# Patient Record
Sex: Female | Born: 1960 | Race: White | Hispanic: No | Marital: Married | Smoking: Never smoker
Health system: Southern US, Community
[De-identification: ages and names within clinical notes are randomized; demographics above are authoritative.]

## PROBLEM LIST (undated history)

## (undated) DIAGNOSIS — E539 Vitamin B deficiency, unspecified: Secondary | ICD-10-CM

## (undated) DIAGNOSIS — F419 Anxiety disorder, unspecified: Secondary | ICD-10-CM

## (undated) DIAGNOSIS — D649 Anemia, unspecified: Secondary | ICD-10-CM

## (undated) DIAGNOSIS — E559 Vitamin D deficiency, unspecified: Secondary | ICD-10-CM

## (undated) DIAGNOSIS — K219 Gastro-esophageal reflux disease without esophagitis: Secondary | ICD-10-CM

## (undated) DIAGNOSIS — T7840XA Allergy, unspecified, initial encounter: Secondary | ICD-10-CM

## (undated) DIAGNOSIS — Z973 Presence of spectacles and contact lenses: Secondary | ICD-10-CM

## (undated) HISTORY — DX: Allergy, unspecified, initial encounter: T78.40XA

## (undated) HISTORY — DX: Anxiety disorder, unspecified: F41.9

## (undated) HISTORY — DX: Gastro-esophageal reflux disease without esophagitis: K21.9

## (undated) HISTORY — DX: Vitamin D deficiency, unspecified: E55.9

## (undated) HISTORY — DX: Vitamin B deficiency, unspecified: E53.9

---

## 2018-02-01 ENCOUNTER — Other Ambulatory Visit: Payer: Self-pay | Admitting: Nurse Practitioner

## 2018-02-01 DIAGNOSIS — Z1231 Encounter for screening mammogram for malignant neoplasm of breast: Secondary | ICD-10-CM

## 2018-02-01 LAB — HM HEPATITIS C SCREENING LAB: HM Hepatitis Screen: NEGATIVE

## 2018-02-08 ENCOUNTER — Inpatient Hospital Stay: Admission: RE | Admit: 2018-02-08 | Payer: Self-pay | Source: Ambulatory Visit

## 2018-02-16 ENCOUNTER — Encounter: Payer: Self-pay | Admitting: Radiology

## 2018-02-16 ENCOUNTER — Ambulatory Visit
Admission: RE | Admit: 2018-02-16 | Discharge: 2018-02-16 | Disposition: A | Discharge status: Home / Self Care | Source: Ambulatory Visit | Attending: Nurse Practitioner | Admitting: Nurse Practitioner

## 2018-02-16 ENCOUNTER — Encounter (INDEPENDENT_AMBULATORY_CARE_PROVIDER_SITE_OTHER): Payer: Self-pay

## 2018-02-16 DIAGNOSIS — Z1231 Encounter for screening mammogram for malignant neoplasm of breast: Secondary | ICD-10-CM | POA: Insufficient documentation

## 2018-02-18 ENCOUNTER — Inpatient Hospital Stay
Admission: RE | Admit: 2018-02-18 | Discharge: 2018-02-18 | Disposition: A | Discharge status: Home / Self Care | Payer: Self-pay | Source: Ambulatory Visit | Attending: *Deleted | Admitting: *Deleted

## 2018-02-18 ENCOUNTER — Other Ambulatory Visit: Payer: Self-pay | Admitting: *Deleted

## 2018-02-18 DIAGNOSIS — Z9289 Personal history of other medical treatment: Secondary | ICD-10-CM

## 2020-10-15 ENCOUNTER — Other Ambulatory Visit: Payer: Self-pay

## 2020-10-15 ENCOUNTER — Encounter: Payer: Self-pay | Admitting: Internal Medicine

## 2020-10-15 ENCOUNTER — Ambulatory Visit (INDEPENDENT_AMBULATORY_CARE_PROVIDER_SITE_OTHER): Admitting: Internal Medicine

## 2020-10-15 VITALS — BP 138/76 | HR 60 | Ht 66.5 in | Wt 225.0 lb

## 2020-10-15 DIAGNOSIS — E538 Deficiency of other specified B group vitamins: Secondary | ICD-10-CM

## 2020-10-15 DIAGNOSIS — E782 Mixed hyperlipidemia: Secondary | ICD-10-CM | POA: Insufficient documentation

## 2020-10-15 DIAGNOSIS — Z1231 Encounter for screening mammogram for malignant neoplasm of breast: Secondary | ICD-10-CM

## 2020-10-15 DIAGNOSIS — Z1211 Encounter for screening for malignant neoplasm of colon: Secondary | ICD-10-CM

## 2020-10-15 DIAGNOSIS — E669 Obesity, unspecified: Secondary | ICD-10-CM | POA: Insufficient documentation

## 2020-10-15 DIAGNOSIS — K219 Gastro-esophageal reflux disease without esophagitis: Secondary | ICD-10-CM | POA: Insufficient documentation

## 2020-10-15 DIAGNOSIS — E559 Vitamin D deficiency, unspecified: Secondary | ICD-10-CM

## 2020-10-15 HISTORY — DX: Gastro-esophageal reflux disease without esophagitis: K21.9

## 2020-10-15 NOTE — Progress Notes (Signed)
Date:  10/15/2020   Name:  Jessica Walter   DOB:  1961-04-15   MRN:  [id number]   Chief Complaint: Establish Care and Gastroesophageal Reflux (Raw honey, apple cider vinegar, and apple slices is helping soothe this but she wants to know why she has such bad stomach acid issues.)  Immunization History  Administered Date(s) Administered  . Influenza-Unspecified 06/02/2019, 04/25/2020  . Moderna Sars-Covid-2 Vaccination 11/15/2019, 12/13/2019, 07/24/2020    Gastroesophageal Reflux She complains of heartburn. She reports no abdominal pain, no chest pain, no choking or no dysphagia. This is a recurrent problem. The problem has been waxing and waning. The heartburn duration is less than a minute. The heartburn is located in the substernum. The heartburn does not wake her from sleep. The heartburn changes with position. The symptoms are aggravated by stress and bending. Associated symptoms include fatigue. Treatments tried: vinegar, honey, apple.  She admits to many recent life stressors over the past few years.  She has moved several times as well as several family tragedies.  She is finally in [place] where she wants to be.  Previous PCP wanted her to take Effexor but she did not chose to take it.  She feels like her situation is improving but still contributing to her stomach issues.   No results found for: CREATININE, BUN, NA, K, CL, CO2 No results found for: CHOL, HDL, LDLCALC, LDLDIRECT, TRIG, CHOLHDL No results found for: TSH No results found for: HGBA1C No results found for: WBC, HGB, HCT, MCV, PLT No results found for: ALT, AST, GGT, ALKPHOS, BILITOT   Review of Systems  Constitutional: Positive for fatigue and unexpected weight change.  Respiratory: Negative for choking and shortness of breath.   Cardiovascular: Negative for chest pain, palpitations and leg swelling.  Gastrointestinal: Positive for heartburn. Negative for abdominal distention, abdominal pain, blood in stool and  dysphagia.       Gerd  Neurological: Negative for dizziness, light-headedness and headaches.  Psychiatric/Behavioral: Positive for decreased concentration and sleep disturbance. Negative for suicidal ideas.    There are no problems to display for this patient.   No Known Allergies  History reviewed. No pertinent surgical history.  Social History   Tobacco Use  . Smoking status: Never Smoker  . Smokeless tobacco: Never Used  Vaping Use  . Vaping Use: Never used  Substance Use Topics  . Alcohol use: Yes    Comment: rare  . Drug use: Never     Medication list has been reviewed and updated.  Current Meds  Medication Sig  . Multiple Vitamin (MULTIVITAMIN) capsule Take 1 capsule by mouth daily.  Marland Kitchen VALERIAN ROOT PLUS PO Take by mouth.    PHQ 2/9 Scores 10/15/2020  PHQ - 2 Score 2  PHQ- 9 Score 10    GAD 7 : Generalized Anxiety Score 10/15/2020  Nervous, Anxious, on Edge 1  Control/stop worrying 1  Worry too much - different things 1  Trouble relaxing 2  Restless 1  Easily annoyed or irritable 0  Afraid - awful might happen 1  Total GAD 7 Score 7  Anxiety Difficulty Not difficult at all    BP Readings from Last 3 Encounters:  10/15/20 138/76    Physical Exam Constitutional:      Appearance: Normal appearance.  Cardiovascular:     Rate and Rhythm: Normal rate and regular rhythm.     Pulses: Normal pulses.     Heart sounds: No murmur heard.   Pulmonary:  Effort: Pulmonary effort is normal.     Breath sounds: Normal breath sounds. No wheezing.  Abdominal:     General: There is distension.     Palpations: Abdomen is soft.     Tenderness: There is abdominal tenderness in the epigastric area. There is no right CVA tenderness, left CVA tenderness, guarding or rebound.     Hernia: No hernia is present.  Musculoskeletal:     Cervical back: Normal range of motion.  Lymphadenopathy:     Cervical: No cervical adenopathy.  Neurological:     Mental Status: She  is alert.  Psychiatric:        Attention and Perception: Attention normal.        Mood and Affect: Mood is anxious.        Speech: Speech normal.        Cognition and Memory: Cognition normal.     Wt Readings from Last 3 Encounters:  10/15/20 225 lb (102.1 kg)    BP 138/76   Pulse 60   Ht 5' 6.5" (1.689 m)   Wt 225 lb (102.1 kg)   SpO2 95%   BMI 35.77 kg/m   Assessment and Plan: 1. Gastroesophageal reflux disease, unspecified whether esophagitis present Ongoing gerd sx partially treated with home remedies.  However we need to find the cause and address it more effectively. If H Pylori is negative will consider GI referral. - CBC with Differential/Platelet - H. pylori breath test  2. Vitamin B12 deficiency On supplements in the past; currently just a multi-vitamin - Vitamin B12  3. Vitamin D deficiency On supplements in the past; currently just a multi-vitamin - VITAMIN D 25 Hydroxy (Vit-D Deficiency, Fractures)  4. Colon cancer screening Overdue for screening - not ready to have a colonoscopy at this time - Fecal occult blood, imunochemical  5. Encounter for screening mammogram for breast cancer Last mammogram 2019 - - MM 3D SCREEN BREAST BILATERAL; Future   Partially dictated using Editor, commissioning. Any errors are unintentional.  Halina Maidens, MD [facility]  10/15/2020

## 2020-10-16 LAB — CBC WITH DIFFERENTIAL/PLATELET
Basophils Absolute: 0.1 10*3/uL (ref 0.0–0.2)
Basos: 1 %
EOS (ABSOLUTE): 0.1 10*3/uL (ref 0.0–0.4)
Eos: 2 %
Hematocrit: 41.1 % (ref 34.0–46.6)
Hemoglobin: 13.7 g/dL (ref 11.1–15.9)
Immature Grans (Abs): 0 10*3/uL (ref 0.0–0.1)
Immature Granulocytes: 0 %
Lymphocytes Absolute: 2.6 10*3/uL (ref 0.7–3.1)
Lymphs: 33 %
MCH: 30.2 pg (ref 26.6–33.0)
MCHC: 33.3 g/dL (ref 31.5–35.7)
MCV: 91 fL (ref 79–97)
Monocytes Absolute: 0.4 10*3/uL (ref 0.1–0.9)
Monocytes: 5 %
Neutrophils Absolute: 4.7 10*3/uL (ref 1.4–7.0)
Neutrophils: 59 %
Platelets: 320 10*3/uL (ref 150–450)
RBC: 4.53 x10E6/uL (ref 3.77–5.28)
RDW: 12.8 % (ref 11.7–15.4)
WBC: 8 10*3/uL (ref 3.4–10.8)

## 2020-10-16 LAB — VITAMIN B12: Vitamin B-12: 707 pg/mL (ref 232–1245)

## 2020-10-16 LAB — VITAMIN D 25 HYDROXY (VIT D DEFICIENCY, FRACTURES): Vit D, 25-Hydroxy: 38 ng/mL (ref 30.0–100.0)

## 2020-10-17 ENCOUNTER — Other Ambulatory Visit: Payer: Self-pay | Admitting: Internal Medicine

## 2020-10-17 DIAGNOSIS — A048 Other specified bacterial intestinal infections: Secondary | ICD-10-CM

## 2020-10-17 LAB — H. PYLORI BREATH TEST: H pylori Breath Test: POSITIVE — AB

## 2020-10-17 MED ORDER — AMOXICILLIN 500 MG PO CAPS: 1000.0000 mg | ORAL_CAPSULE | Freq: Two times a day (BID) | ORAL | 0 refills | Status: AC

## 2020-10-17 MED ORDER — OMEPRAZOLE 20 MG PO CPDR
20.0000 mg | DELAYED_RELEASE_CAPSULE | Freq: Two times a day (BID) | ORAL | 0 refills | Status: DC
Start: 2020-10-17 — End: 2021-02-12

## 2020-10-17 MED ORDER — CLARITHROMYCIN 500 MG PO TABS
500.0000 mg | ORAL_TABLET | Freq: Two times a day (BID) | ORAL | 0 refills | Status: AC
Start: 2020-10-17 — End: 2020-10-27

## 2020-11-20 ENCOUNTER — Other Ambulatory Visit: Payer: Self-pay

## 2020-11-20 DIAGNOSIS — Z1211 Encounter for screening for malignant neoplasm of colon: Secondary | ICD-10-CM

## 2020-11-20 LAB — FECAL OCCULT BLOOD, IMMUNOCHEMICAL

## 2020-11-27 LAB — FECAL OCCULT BLOOD, IMMUNOCHEMICAL: Fecal Occult Bld: NEGATIVE

## 2020-12-24 ENCOUNTER — Other Ambulatory Visit: Payer: Self-pay | Admitting: Internal Medicine

## 2020-12-24 ENCOUNTER — Other Ambulatory Visit: Payer: Self-pay

## 2020-12-24 ENCOUNTER — Ambulatory Visit
Admission: RE | Admit: 2020-12-24 | Discharge: 2020-12-24 | Disposition: A | Discharge status: Home / Self Care | Source: Ambulatory Visit | Attending: Internal Medicine | Admitting: Internal Medicine

## 2020-12-24 DIAGNOSIS — Z1231 Encounter for screening mammogram for malignant neoplasm of breast: Secondary | ICD-10-CM | POA: Diagnosis not present

## 2020-12-24 DIAGNOSIS — R928 Other abnormal and inconclusive findings on diagnostic imaging of breast: Secondary | ICD-10-CM

## 2020-12-26 ENCOUNTER — Telehealth: Payer: Self-pay | Admitting: Internal Medicine

## 2020-12-26 ENCOUNTER — Encounter: Payer: Self-pay | Admitting: Internal Medicine

## 2020-12-26 NOTE — Telephone Encounter (Signed)
Sent pt a message on mychart.  KP

## 2020-12-26 NOTE — Telephone Encounter (Signed)
Pt is calling to ask about allergy testing after---- allegra is not working. Pt states that she has an appt with Dr. Amanda Pea in June. Please advise Cb- [phone]

## 2021-01-02 ENCOUNTER — Ambulatory Visit
Admission: RE | Admit: 2021-01-02 | Discharge: 2021-01-02 | Disposition: A | Discharge status: Home / Self Care | Source: Ambulatory Visit | Attending: Internal Medicine | Admitting: Internal Medicine

## 2021-01-02 ENCOUNTER — Other Ambulatory Visit: Payer: Self-pay

## 2021-01-02 ENCOUNTER — Other Ambulatory Visit: Payer: Self-pay | Admitting: Internal Medicine

## 2021-01-02 DIAGNOSIS — R928 Other abnormal and inconclusive findings on diagnostic imaging of breast: Secondary | ICD-10-CM

## 2021-01-07 ENCOUNTER — Ambulatory Visit
Admission: RE | Admit: 2021-01-07 | Discharge: 2021-01-07 | Disposition: A | Discharge status: Home / Self Care | Source: Ambulatory Visit | Attending: Internal Medicine | Admitting: Internal Medicine

## 2021-01-07 ENCOUNTER — Other Ambulatory Visit: Payer: Self-pay

## 2021-01-07 DIAGNOSIS — R928 Other abnormal and inconclusive findings on diagnostic imaging of breast: Secondary | ICD-10-CM

## 2021-01-07 HISTORY — PX: BREAST BIOPSY: SHX20

## 2021-01-08 ENCOUNTER — Encounter: Payer: Self-pay | Admitting: Internal Medicine

## 2021-01-08 LAB — SURGICAL PATHOLOGY

## 2021-02-12 ENCOUNTER — Other Ambulatory Visit: Payer: Self-pay | Admitting: Internal Medicine

## 2021-02-13 ENCOUNTER — Encounter: Payer: Self-pay | Admitting: Internal Medicine

## 2021-02-13 ENCOUNTER — Ambulatory Visit (INDEPENDENT_AMBULATORY_CARE_PROVIDER_SITE_OTHER): Admitting: Internal Medicine

## 2021-02-13 ENCOUNTER — Other Ambulatory Visit (HOSPITAL_COMMUNITY)
Admission: RE | Admit: 2021-02-13 | Discharge: 2021-02-13 | Disposition: A | Discharge status: Home / Self Care | Source: Ambulatory Visit | Attending: Internal Medicine | Admitting: Internal Medicine

## 2021-02-13 ENCOUNTER — Other Ambulatory Visit: Payer: Self-pay

## 2021-02-13 VITALS — BP 124/82 | HR 90 | Resp 97 | Ht 66.5 in | Wt 223.0 lb

## 2021-02-13 DIAGNOSIS — Z124 Encounter for screening for malignant neoplasm of cervix: Secondary | ICD-10-CM | POA: Diagnosis not present

## 2021-02-13 DIAGNOSIS — M533 Sacrococcygeal disorders, not elsewhere classified: Secondary | ICD-10-CM | POA: Insufficient documentation

## 2021-02-13 DIAGNOSIS — Z0000 Encounter for general adult medical examination without abnormal findings: Secondary | ICD-10-CM

## 2021-02-13 DIAGNOSIS — E782 Mixed hyperlipidemia: Secondary | ICD-10-CM | POA: Diagnosis not present

## 2021-02-13 DIAGNOSIS — K219 Gastro-esophageal reflux disease without esophagitis: Secondary | ICD-10-CM | POA: Diagnosis not present

## 2021-02-13 DIAGNOSIS — D485 Neoplasm of uncertain behavior of skin: Secondary | ICD-10-CM | POA: Diagnosis not present

## 2021-02-13 DIAGNOSIS — F39 Unspecified mood [affective] disorder: Secondary | ICD-10-CM | POA: Diagnosis not present

## 2021-02-13 DIAGNOSIS — J3089 Other allergic rhinitis: Secondary | ICD-10-CM | POA: Diagnosis not present

## 2021-02-13 NOTE — Progress Notes (Signed)
Date:  02/13/2021   Name:  Jessica Walter   DOB:  09/12/1960   MRN:  [id number]   Chief Complaint: Annual Exam (Breast exam no pap) Jessica Walter is a 60 y.o. female who presents today for her Complete Annual Exam. She feels fairly well. She reports exercising - walking. She reports she is sleeping well. Breast complaints - none. Hx of large endocervical polyp many years ago.  Mammogram: 12/2020 DEXA: none Pap smear: ? Colonoscopy: 11/2020 FIT  Immunization History  Administered Date(s) Administered   Influenza-Unspecified 06/02/2019, 04/25/2020   Moderna Sars-Covid-2 Vaccination 11/15/2019, 12/13/2019, 07/24/2020    HPI Skin lesion - lesion on mid back for some time, seems to be enlarging and is irritated by her bra.  She also gets red around her neck intermittently - not sure what the trigger is.  She has not established with Derm here.  Allergies - she has intermittent flares with allergy symptoms - primarily head congestion, runny nose and dry cough.  It flares up with exposures to dust, pollen, etc.  Remote allergy testing only revealed dust allergy.  She take Allegra D regularly but would like to be evaluated.  Anxiety - much worse recently due to loss of mother in law, niece and nephew and most recently 2 beloved cats.  She feels she is dealing with this fairly well.  She does not want medication at this time.  Left sided sacro-iliac pain and left sided sciatica - She had remote MRI - normal.  Intermittent low back pain if she over does physical activity.  She does yoga and that seems to help.  Dyspareunia - she has pain with intercourse since the endocervical polyp was removed.  She has tried lubricants, etc which help somewhat.  She is concerned since it has been quite a few years since a pelvic exam.  She denies bleeding, discharge and concern for STI.  No results found for: CREATININE, BUN, NA, K, CL, CO2 No results found for: CHOL, HDL, LDLCALC, LDLDIRECT, TRIG, CHOLHDL No  results found for: TSH No results found for: HGBA1C Lab Results  Component Value Date   WBC 8.0 10/15/2020   HGB 13.7 10/15/2020   HCT 41.1 10/15/2020   MCV 91 10/15/2020   PLT 320 10/15/2020   No results found for: ALT, AST, GGT, ALKPHOS, BILITOT   Review of Systems  Constitutional:  Negative for chills, fatigue and fever.  HENT:  Negative for congestion, hearing loss, tinnitus, trouble swallowing and voice change.   Eyes:  Negative for visual disturbance.  Respiratory:  Negative for cough, chest tightness, shortness of breath and wheezing.   Cardiovascular:  Negative for chest pain, palpitations and leg swelling.  Gastrointestinal:  Negative for abdominal pain, constipation, diarrhea and vomiting.  Endocrine: Negative for polydipsia and polyuria.  Genitourinary:  Negative for dysuria, frequency, genital sores, vaginal bleeding and vaginal discharge.  Musculoskeletal:  Positive for back pain. Negative for arthralgias, gait problem and joint swelling.  Skin:  Negative for color change and rash.  Neurological:  Negative for dizziness, tremors, light-headedness and headaches.  Hematological:  Negative for adenopathy. Does not bruise/bleed easily.  Psychiatric/Behavioral:  Negative for dysphoric mood and sleep disturbance. The patient is nervous/anxious.    Patient Active Problem List   Diagnosis Date Noted   Environmental and seasonal allergies 02/13/2021   Gastroesophageal reflux disease 10/15/2020   Vitamin B12 deficiency 10/15/2020   Vitamin D deficiency 10/15/2020   Colon cancer screening 10/15/2020   Mixed hyperlipidemia 10/15/2020  Obesity (BMI 30-39.9) 10/15/2020    No Known Allergies  Past Surgical History:  Procedure Laterality Date   BREAST BIOPSY Right 01/07/2021   affirm bx distortion, ribbon marker, path pending    Social History   Tobacco Use   Smoking status: Never   Smokeless tobacco: Never  Vaping Use   Vaping Use: Never used  Substance Use Topics    Alcohol use: Yes    Comment: rare   Drug use: Never     Medication list has been reviewed and updated.  Current Meds  Medication Sig   fexofenadine (ALLEGRA) 60 MG tablet Take 60 mg by mouth 2 (two) times daily.   Melatonin 10 MG TABS Take by mouth. Olly Brand   Multiple Vitamin (MULTIVITAMIN) capsule Take 1 capsule by mouth daily.   Probiotic Product (UP4 PROBIOTICS ADULT PO) Take by mouth.   Theanine 50 MG TBDP Take by mouth. Goodbye Stress by Loretha Stapler ROOT PLUS PO Take by mouth.    PHQ 2/9 Scores 02/13/2021 02/13/2021 10/15/2020  PHQ - 2 Score 2 2 2   PHQ- 9 Score 10 4 10     GAD 7 : Generalized Anxiety Score 02/13/2021 10/15/2020  Nervous, Anxious, on Edge 2 1  Control/stop worrying 2 1  Worry too much - different things 2 1  Trouble relaxing 1 2  Restless 1 1  Easily annoyed or irritable 0 0  Afraid - awful might happen 3 1  Total GAD 7 Score 11 7  Anxiety Difficulty Very difficult Not difficult at all    BP Readings from Last 3 Encounters:  02/13/21 124/82  10/15/20 138/76    Physical Exam Vitals and nursing note reviewed.  Constitutional:      General: She is not in acute distress.    Appearance: She is well-developed.  HENT:     Head: Normocephalic and atraumatic.     Right Ear: Tympanic membrane and ear canal normal.     Left Ear: Tympanic membrane and ear canal normal.     Nose:     Right Sinus: No maxillary sinus tenderness.     Left Sinus: No maxillary sinus tenderness.  Eyes:     General: No scleral icterus.       Right eye: No discharge.        Left eye: No discharge.     Conjunctiva/sclera: Conjunctivae normal.  Neck:     Thyroid: No thyromegaly.     Vascular: No carotid bruit.  Cardiovascular:     Rate and Rhythm: Normal rate and regular rhythm.     Pulses: Normal pulses.     Heart sounds: Normal heart sounds.  Pulmonary:     Effort: Pulmonary effort is normal. No respiratory distress.     Breath sounds: No wheezing.  Chest:   Breasts:    Right: No mass, nipple discharge, skin change or tenderness.     Left: No mass, nipple discharge, skin change or tenderness.  Abdominal:     General: Bowel sounds are normal.     Palpations: Abdomen is soft.     Tenderness: There is no abdominal tenderness.  Genitourinary:    Pubic Area: No rash.      Labia:        Right: No tenderness, lesion or injury.        Left: No tenderness, lesion or injury.      Vagina: Normal.     Cervix: Normal.     Uterus: Normal.  Adnexa: Right adnexa normal and left adnexa normal.  Musculoskeletal:        General: Normal range of motion.     Cervical back: Normal range of motion. No erythema.     Right lower leg: No edema.     Left lower leg: No edema.  Lymphadenopathy:     Cervical: No cervical adenopathy.  Skin:    General: Skin is warm and dry.     Capillary Refill: Capillary refill takes less than 2 seconds.     Findings: Lesion present. No rash.          Comments: Inflamed keratosis   Neurological:     General: No focal deficit present.     Mental Status: She is alert and oriented to person, place, and time.     Cranial Nerves: No cranial nerve deficit.     Sensory: No sensory deficit.     Deep Tendon Reflexes: Reflexes are normal and symmetric.  Psychiatric:        Attention and Perception: Attention normal.        Mood and Affect: Mood is anxious.        Speech: Speech is rapid and pressured.        Behavior: Behavior normal.        Cognition and Memory: Cognition normal.    Wt Readings from Last 3 Encounters:  02/13/21 223 lb (101.2 kg)  10/15/20 225 lb (102.1 kg)    BP 124/82   Pulse 90   Resp (!) 97   Ht 5' 6.5" (1.689 m)   Wt 223 lb (101.2 kg)   BMI 35.45 kg/m   Assessment and Plan: 1. Annual physical exam Exam is normal except for weight. Encourage regular exercise and appropriate dietary changes. - Comprehensive metabolic panel - TSH - Hemoglobin A1c  2. Gastroesophageal reflux disease,  unspecified whether esophagitis present Intermittent gerd sx not requiring any medications. - CBC with Differential/Platelet  3. Mixed hyperlipidemia Check labs and advise - Lipid panel  4. Environmental and seasonal allergies Continue Allegra-D Pt requests allergy evaluation - Ambulatory referral to ENT  5. Encounter for screening for cervical cancer Pap obtained today; exam normal. Patient is reassured that no obvious cause for dyspareunia found. - Cytology - PAP  6. Neoplasm of uncertain behavior of skin Recommend Dermatology evaluation - Ambulatory referral to Dermatology  7. Mood disorder ([place]) She does have more anxiety today due to recent stressors but she declines medication at this time. Recommend more regular exercise Continue Yoga Return if sx are not improving.  8. Sacro-iliac pain Continue yoga, core strengthening exercises Avoid strenuous low back activities   Partially dictated using Editor, commissioning. Any errors are unintentional.  Halina Maidens, MD [facility]  02/13/2021

## 2021-02-14 ENCOUNTER — Other Ambulatory Visit: Payer: Self-pay | Admitting: Internal Medicine

## 2021-02-14 DIAGNOSIS — R922 Inconclusive mammogram: Secondary | ICD-10-CM

## 2021-02-14 LAB — COMPREHENSIVE METABOLIC PANEL
ALT: 19 IU/L (ref 0–32)
AST: 20 IU/L (ref 0–40)
Albumin/Globulin Ratio: 1.6 (ref 1.2–2.2)
Albumin: 4.6 g/dL (ref 3.8–4.9)
Alkaline Phosphatase: 88 IU/L (ref 44–121)
BUN/Creatinine Ratio: 16 (ref 9–23)
BUN: 14 mg/dL (ref 6–24)
Bilirubin Total: 0.4 mg/dL (ref 0.0–1.2)
CO2: 22 mmol/L (ref 20–29)
Calcium: 9.1 mg/dL (ref 8.7–10.2)
Chloride: 101 mmol/L (ref 96–106)
Creatinine, Ser: 0.87 mg/dL (ref 0.57–1.00)
Globulin, Total: 2.9 g/dL (ref 1.5–4.5)
Glucose: 100 mg/dL — ABNORMAL HIGH (ref 65–99)
Potassium: 4.3 mmol/L (ref 3.5–5.2)
Sodium: 141 mmol/L (ref 134–144)
Total Protein: 7.5 g/dL (ref 6.0–8.5)
eGFR: 77 mL/min/{1.73_m2} (ref >59)

## 2021-02-14 LAB — CYTOLOGY - PAP: Diagnosis: NEGATIVE

## 2021-02-14 LAB — CBC WITH DIFFERENTIAL/PLATELET
Basophils Absolute: 0.1 10*3/uL (ref 0.0–0.2)
Basos: 1 %
EOS (ABSOLUTE): 0.2 10*3/uL (ref 0.0–0.4)
Eos: 2 %
Hematocrit: 42.8 % (ref 34.0–46.6)
Hemoglobin: 13.9 g/dL (ref 11.1–15.9)
Immature Grans (Abs): 0 10*3/uL (ref 0.0–0.1)
Immature Granulocytes: 0 %
Lymphocytes Absolute: 2.4 10*3/uL (ref 0.7–3.1)
Lymphs: 29 %
MCH: 29.4 pg (ref 26.6–33.0)
MCHC: 32.5 g/dL (ref 31.5–35.7)
MCV: 91 fL (ref 79–97)
Monocytes Absolute: 0.5 10*3/uL (ref 0.1–0.9)
Monocytes: 6 %
Neutrophils Absolute: 4.9 10*3/uL (ref 1.4–7.0)
Neutrophils: 62 %
Platelets: 338 10*3/uL (ref 150–450)
RBC: 4.73 x10E6/uL (ref 3.77–5.28)
RDW: 12.9 % (ref 11.7–15.4)
WBC: 8.1 10*3/uL (ref 3.4–10.8)

## 2021-02-14 LAB — LIPID PANEL
Chol/HDL Ratio: 2.8 ratio (ref 0.0–4.4)
Cholesterol, Total: 205 mg/dL — ABNORMAL HIGH (ref 100–199)
HDL: 72 mg/dL (ref >39)
LDL Chol Calc (NIH): 117 mg/dL — ABNORMAL HIGH (ref 0–99)
Triglycerides: 89 mg/dL (ref 0–149)
VLDL Cholesterol Cal: 16 mg/dL (ref 5–40)

## 2021-02-14 LAB — TSH: TSH: 1.34 u[IU]/mL (ref 0.450–4.500)

## 2021-02-14 LAB — HEMOGLOBIN A1C
Est. average glucose Bld gHb Est-mCnc: 114 mg/dL
Hgb A1c MFr Bld: 5.6 % (ref 4.8–5.6)

## 2021-02-25 ENCOUNTER — Encounter: Payer: Self-pay | Admitting: Internal Medicine

## 2021-07-07 ENCOUNTER — Other Ambulatory Visit: Payer: Self-pay

## 2021-07-07 ENCOUNTER — Ambulatory Visit
Admission: RE | Admit: 2021-07-07 | Discharge: 2021-07-07 | Disposition: A | Discharge status: Home / Self Care | Source: Ambulatory Visit | Attending: Internal Medicine | Admitting: Internal Medicine

## 2021-07-07 DIAGNOSIS — R922 Inconclusive mammogram: Secondary | ICD-10-CM | POA: Insufficient documentation

## 2021-07-14 ENCOUNTER — Encounter: Payer: Self-pay | Admitting: Internal Medicine

## 2021-07-15 ENCOUNTER — Encounter

## 2021-07-15 ENCOUNTER — Other Ambulatory Visit

## 2021-07-28 ENCOUNTER — Encounter: Payer: Self-pay | Admitting: Internal Medicine

## 2021-07-28 ENCOUNTER — Other Ambulatory Visit: Payer: Self-pay

## 2021-07-28 DIAGNOSIS — R928 Other abnormal and inconclusive findings on diagnostic imaging of breast: Secondary | ICD-10-CM

## 2021-12-25 ENCOUNTER — Ambulatory Visit
Admission: RE | Admit: 2021-12-25 | Discharge: 2021-12-25 | Disposition: A | Discharge status: Home / Self Care | Source: Ambulatory Visit | Attending: Internal Medicine | Admitting: Internal Medicine

## 2021-12-25 DIAGNOSIS — R928 Other abnormal and inconclusive findings on diagnostic imaging of breast: Secondary | ICD-10-CM | POA: Insufficient documentation

## 2022-02-17 ENCOUNTER — Ambulatory Visit (INDEPENDENT_AMBULATORY_CARE_PROVIDER_SITE_OTHER): Admitting: Internal Medicine

## 2022-02-17 ENCOUNTER — Encounter: Payer: Self-pay | Admitting: Internal Medicine

## 2022-02-17 VITALS — BP 140/86 | HR 67 | Ht 66.5 in | Wt 233.0 lb

## 2022-02-17 DIAGNOSIS — E782 Mixed hyperlipidemia: Secondary | ICD-10-CM | POA: Diagnosis not present

## 2022-02-17 DIAGNOSIS — E538 Deficiency of other specified B group vitamins: Secondary | ICD-10-CM | POA: Diagnosis not present

## 2022-02-17 DIAGNOSIS — Z0184 Encounter for antibody response examination: Secondary | ICD-10-CM

## 2022-02-17 DIAGNOSIS — F39 Unspecified mood [affective] disorder: Secondary | ICD-10-CM

## 2022-02-17 DIAGNOSIS — Z1211 Encounter for screening for malignant neoplasm of colon: Secondary | ICD-10-CM

## 2022-02-17 DIAGNOSIS — Z0000 Encounter for general adult medical examination without abnormal findings: Secondary | ICD-10-CM | POA: Diagnosis not present

## 2022-02-17 DIAGNOSIS — Z23 Encounter for immunization: Secondary | ICD-10-CM

## 2022-02-17 NOTE — Progress Notes (Signed)
Date:  02/17/2022   Name:  Jessica Walter   DOB:  Jan 09, 1961   MRN:  [id number]   Chief Complaint: Annual Exam Zikra Grover is a 61 y.o. female who presents today for her Complete Annual Exam. She feels well. She reports exercising none. She reports she is sleeping fairly well. Breast complaints - none.  Mammogram: 12/2021 DEXA: none Pap smear: 01/2021 neg thin prep Colonoscopy: FIT 11/2020  Health Maintenance Due  Topic Date Due   HIV Screening  Never done   TETANUS/TDAP  Never done   Zoster Vaccines- Shingrix (1 of 2) Never done   COLONOSCOPY (Pts 45-57yrs Insurance coverage will need to be confirmed)  Never done   COVID-19 Vaccine (4 - Booster for Moderna series) 09/18/2020   COLON CANCER SCREENING ANNUAL FOBT  11/22/2021    Immunization History  Administered Date(s) Administered   Influenza-Unspecified 06/02/2019, 04/25/2020, 06/10/2021   Moderna Sars-Covid-2 Vaccination 11/15/2019, 12/13/2019, 07/24/2020    Anxiety Presents for follow-up visit. Symptoms include excessive worry, insomnia and nervous/anxious behavior. Patient reports no chest pain, dizziness, palpitations, shortness of breath or suicidal ideas. Symptoms occur most days. The severity of symptoms is moderate (she is not interested in counseling - she does not feel that she would be comfortable.  She is finding acitvities outside the home and staying active in the home.).      Lab Results  Component Value Date   NA 141 02/13/2021   K 4.3 02/13/2021   CO2 22 02/13/2021   GLUCOSE 100 (H) 02/13/2021   BUN 14 02/13/2021   CREATININE 0.87 02/13/2021   CALCIUM 9.1 02/13/2021   EGFR 77 02/13/2021   Lab Results  Component Value Date   CHOL 205 (H) 02/13/2021   HDL 72 02/13/2021   LDLCALC 117 (H) 02/13/2021   TRIG 89 02/13/2021   CHOLHDL 2.8 02/13/2021   Lab Results  Component Value Date   TSH 1.340 02/13/2021   Lab Results  Component Value Date   HGBA1C 5.6 02/13/2021   Lab Results  Component Value  Date   WBC 8.1 02/13/2021   HGB 13.9 02/13/2021   HCT 42.8 02/13/2021   MCV 91 02/13/2021   PLT 338 02/13/2021   Lab Results  Component Value Date   ALT 19 02/13/2021   AST 20 02/13/2021   ALKPHOS 88 02/13/2021   BILITOT 0.4 02/13/2021   Lab Results  Component Value Date   VD25OH 38.0 10/15/2020     Review of Systems  Constitutional:  Negative for chills, fatigue and fever.  HENT:  Positive for postnasal drip. Negative for congestion, hearing loss, tinnitus, trouble swallowing and voice change.   Eyes:  Negative for visual disturbance.  Respiratory:  Negative for cough, chest tightness, shortness of breath and wheezing.   Cardiovascular:  Negative for chest pain, palpitations and leg swelling.  Gastrointestinal:  Negative for abdominal pain, constipation, diarrhea and vomiting.  Endocrine: Negative for polydipsia and polyuria.  Genitourinary:  Negative for dysuria, frequency, genital sores, vaginal bleeding and vaginal discharge.  Musculoskeletal:  Positive for arthralgias (mild OA hands) and back pain (intermittent low back pain on the right). Negative for gait problem and joint swelling.  Skin:  Negative for color change and rash.  Allergic/Immunologic: Positive for environmental allergies.  Neurological:  Negative for dizziness, tremors, light-headedness and headaches.  Hematological:  Negative for adenopathy. Does not bruise/bleed easily.  Psychiatric/Behavioral:  Positive for sleep disturbance. Negative for dysphoric mood and suicidal ideas. The patient is nervous/anxious and has  insomnia.     Patient Active Problem List   Diagnosis Date Noted   Environmental and seasonal allergies 02/13/2021   Sacro-iliac pain 02/13/2021   Mood disorder (HCC) 02/13/2021   Neoplasm of uncertain behavior of skin 02/13/2021   Vitamin B12 deficiency 10/15/2020   Vitamin D deficiency 10/15/2020   Mixed hyperlipidemia 10/15/2020   Obesity (BMI 30-39.9) 10/15/2020    No Known  Allergies  Past Surgical History:  Procedure Laterality Date   BREAST BIOPSY Right 01/07/2021   affirm bx distortion, ribbon marker, path pending    Social History   Tobacco Use   Smoking status: Never   Smokeless tobacco: Never  Vaping Use   Vaping Use: Never used  Substance Use Topics   Alcohol use: Yes    Comment: rare   Drug use: Never     Medication list has been reviewed and updated.  Current Meds  Medication Sig   Cholecalciferol (VITAMIN D-3) 125 MCG (5000 UT) TABS Take by mouth.   Melatonin 10 MG TABS Take by mouth. Olly Brand   Multiple Vitamins-Minerals (MULTIVITAMIN WITH MINERALS) tablet Take 1 tablet by mouth daily.   Probiotic Product (UP4 PROBIOTICS ADULT PO) Take by mouth.   VALERIAN ROOT PLUS PO Take by mouth.   [DISCONTINUED] Multiple Vitamin (MULTIVITAMIN) capsule Take 1 capsule by mouth daily.       02/17/2022    8:38 AM 02/13/2021    9:14 AM 10/15/2020    3:23 PM  GAD 7 : Generalized Anxiety Score  Nervous, Anxious, on Edge 3 2 1   Control/stop worrying 2 2 1   Worry too much - different things 3 2 1   Trouble relaxing 2 1 2   Restless 3 1 1   Easily annoyed or irritable 2 0 0  Afraid - awful might happen 2 3 1   Total GAD 7 Score 17 11 7   Anxiety Difficulty Very difficult Very difficult Not difficult at all       02/17/2022    8:38 AM  Depression screen PHQ 2/9  Decreased Interest 1  Down, Depressed, Hopeless 1  PHQ - 2 Score 2  Altered sleeping 3  Tired, decreased energy 2  Change in appetite 2  Feeling bad or failure about yourself  2  Trouble concentrating 3  Moving slowly or fidgety/restless 2  Suicidal thoughts 0  PHQ-9 Score 16  Difficult doing work/chores Somewhat difficult    BP Readings from Last 3 Encounters:  02/17/22 140/86  02/13/21 124/82  10/15/20 138/76    Physical Exam Vitals and nursing note reviewed.  Constitutional:      General: She is not in acute distress.    Appearance: She is well-developed.  HENT:      Head: Normocephalic and atraumatic.     Right Ear: Tympanic membrane and ear canal normal.     Left Ear: Tympanic membrane and ear canal normal.     Nose:     Right Sinus: No maxillary sinus tenderness.     Left Sinus: No maxillary sinus tenderness.  Eyes:     General: No scleral icterus.       Right eye: No discharge.        Left eye: No discharge.     Conjunctiva/sclera: Conjunctivae normal.  Neck:     Thyroid: No thyromegaly.     Vascular: No carotid bruit.  Cardiovascular:     Rate and Rhythm: Normal rate and regular rhythm.     Pulses: Normal pulses.     Heart  sounds: Normal heart sounds.  Pulmonary:     Effort: Pulmonary effort is normal. No respiratory distress.     Breath sounds: No wheezing.  Chest:  Breasts:    Right: No mass, nipple discharge, skin change or tenderness.     Left: No mass, nipple discharge, skin change or tenderness.  Abdominal:     General: Bowel sounds are normal.     Palpations: Abdomen is soft.     Tenderness: There is no abdominal tenderness.  Musculoskeletal:     Cervical back: Normal range of motion. No erythema.     Right lower leg: No edema.     Left lower leg: No edema.  Lymphadenopathy:     Cervical: No cervical adenopathy.  Skin:    General: Skin is warm and dry.     Findings: No rash.  Neurological:     Mental Status: She is alert and oriented to person, place, and time.     Cranial Nerves: No cranial nerve deficit.     Sensory: No sensory deficit.     Deep Tendon Reflexes: Reflexes are normal and symmetric.  Psychiatric:        Attention and Perception: Attention normal.        Mood and Affect: Mood normal.     Wt Readings from Last 3 Encounters:  02/17/22 233 lb (105.7 kg)  02/13/21 223 lb (101.2 kg)  10/15/20 225 lb (102.1 kg)    BP 140/86 (BP Location: Right Arm, Cuff Size: Large)   Pulse 67   Ht 5' 6.5" (1.689 m)   Wt 233 lb (105.7 kg)   SpO2 98%   BMI 37.04 kg/m   Assessment and Plan: 1. Annual physical  exam Exam is normal except for weight. Encourage regular exercise and appropriate dietary changes. She has already lost some weight with changes. - CBC with Differential/Platelet - Comprehensive metabolic panel - Hemoglobin A1c - TSH  2. Colon cancer screening Due for annual testing - Fecal occult blood, imunochemical  3. Vitamin B12 deficiency Check levels and advise - Vitamin B12  4. Mixed hyperlipidemia Continue low fat diet; will advise if medications are recommended. - Lipid panel  5. Need for diphtheria-tetanus-pertussis (Tdap) vaccine She believes she has had one in the past 5 year. Encouraged to return for booster if she has an injury with skin disruption.  6. Need for shingles vaccine Unsure if she had Varicella  7. Mood disorder (HCC) Stable moderate anxiety for years She prefers to avoid medication unless absolutely necessary  8. Immunity status testing - Varicella Zoster Abs, IgG/IgM   Partially dictated using Animal nutritionist. Any errors are unintentional.  Bari Edward, MD [facility]  02/17/2022

## 2022-02-18 LAB — CBC WITH DIFFERENTIAL/PLATELET
Basophils Absolute: 0.1 10*3/uL (ref 0.0–0.2)
Basos: 1 %
EOS (ABSOLUTE): 0.1 10*3/uL (ref 0.0–0.4)
Eos: 2 %
Hematocrit: 43.9 % (ref 34.0–46.6)
Hemoglobin: 14.7 g/dL (ref 11.1–15.9)
Immature Grans (Abs): 0 10*3/uL (ref 0.0–0.1)
Immature Granulocytes: 0 %
Lymphocytes Absolute: 2.3 10*3/uL (ref 0.7–3.1)
Lymphs: 33 %
MCH: 30.1 pg (ref 26.6–33.0)
MCHC: 33.5 g/dL (ref 31.5–35.7)
MCV: 90 fL (ref 79–97)
Monocytes Absolute: 0.4 10*3/uL (ref 0.1–0.9)
Monocytes: 6 %
Neutrophils Absolute: 4 10*3/uL (ref 1.4–7.0)
Neutrophils: 58 %
Platelets: 343 10*3/uL (ref 150–450)
RBC: 4.88 x10E6/uL (ref 3.77–5.28)
RDW: 12.6 % (ref 11.7–15.4)
WBC: 6.9 10*3/uL (ref 3.4–10.8)

## 2022-02-18 LAB — VARICELLA ZOSTER ABS, IGG/IGM
Varicella IgM: <0.91 index (ref 0.00–0.90)
Varicella zoster IgG: 382 index (ref >165)

## 2022-02-18 LAB — COMPREHENSIVE METABOLIC PANEL
ALT: 15 IU/L (ref 0–32)
AST: 16 IU/L (ref 0–40)
Albumin/Globulin Ratio: 1.5 (ref 1.2–2.2)
Albumin: 4.4 g/dL (ref 3.8–4.9)
Alkaline Phosphatase: 87 IU/L (ref 44–121)
BUN/Creatinine Ratio: 16 (ref 12–28)
BUN: 12 mg/dL (ref 8–27)
Bilirubin Total: 0.4 mg/dL (ref 0.0–1.2)
CO2: 22 mmol/L (ref 20–29)
Calcium: 9.2 mg/dL (ref 8.7–10.3)
Chloride: 103 mmol/L (ref 96–106)
Creatinine, Ser: 0.75 mg/dL (ref 0.57–1.00)
Globulin, Total: 3 g/dL (ref 1.5–4.5)
Glucose: 102 mg/dL — ABNORMAL HIGH (ref 70–99)
Potassium: 4.9 mmol/L (ref 3.5–5.2)
Sodium: 140 mmol/L (ref 134–144)
Total Protein: 7.4 g/dL (ref 6.0–8.5)
eGFR: 91 mL/min/{1.73_m2} (ref >59)

## 2022-02-18 LAB — HEMOGLOBIN A1C
Est. average glucose Bld gHb Est-mCnc: 111 mg/dL
Hgb A1c MFr Bld: 5.5 % (ref 4.8–5.6)

## 2022-02-18 LAB — VITAMIN B12: Vitamin B-12: 1196 pg/mL (ref 232–1245)

## 2022-02-18 LAB — LIPID PANEL
Chol/HDL Ratio: 3.6 ratio (ref 0.0–4.4)
Cholesterol, Total: 224 mg/dL — ABNORMAL HIGH (ref 100–199)
HDL: 63 mg/dL (ref >39)
LDL Chol Calc (NIH): 144 mg/dL — ABNORMAL HIGH (ref 0–99)
Triglycerides: 94 mg/dL (ref 0–149)
VLDL Cholesterol Cal: 17 mg/dL (ref 5–40)

## 2022-02-18 LAB — TSH: TSH: 1.33 u[IU]/mL (ref 0.450–4.500)

## 2022-02-20 LAB — FECAL OCCULT BLOOD, IMMUNOCHEMICAL: Fecal Occult Bld: POSITIVE — AB

## 2022-02-23 ENCOUNTER — Other Ambulatory Visit: Payer: Self-pay

## 2022-02-23 DIAGNOSIS — R195 Other fecal abnormalities: Secondary | ICD-10-CM

## 2022-02-26 ENCOUNTER — Telehealth: Payer: Self-pay

## 2022-02-26 ENCOUNTER — Encounter: Payer: Self-pay | Admitting: Gastroenterology

## 2022-02-26 ENCOUNTER — Other Ambulatory Visit: Payer: Self-pay

## 2022-02-26 DIAGNOSIS — Z1211 Encounter for screening for malignant neoplasm of colon: Secondary | ICD-10-CM

## 2022-02-26 MED ORDER — PEG 3350-KCL-NA BICARB-NACL 420 G PO SOLR: 4000.0000 mL | Freq: Once | ORAL | 0 refills | Status: AC

## 2022-02-26 NOTE — Progress Notes (Signed)
Gastroenterology Pre-Procedure Review  Request Date: 03/12/2022 Requesting Physician: Dr. Marius Ditch  PATIENT REVIEW QUESTIONS: The patient responded to the following health history questions as indicated:    1. Are you having any GI issues? no 2. Do you have a personal history of Polyps? no 3. Do you have a family history of Colon Cancer or Polyps? no 4. Diabetes Mellitus? no 5. Joint replacements in the past 12 months?no 6. Major health problems in the past 3 months?no 7. Any artificial heart valves, MVP, or defibrillator?no    MEDICATIONS & ALLERGIES:    Patient reports the following regarding taking any anticoagulation/antiplatelet therapy:   Plavix, Coumadin, Eliquis, Xarelto, Lovenox, Pradaxa, Brilinta, or Effient? no Aspirin? no  Patient confirms/reports the following medications:  Current Outpatient Medications  Medication Sig Dispense Refill   Cholecalciferol (VITAMIN D-3) 125 MCG (5000 UT) TABS Take by mouth.     Melatonin 10 MG TABS Take by mouth. Olly Brand     Multiple Vitamins-Minerals (MULTIVITAMIN WITH MINERALS) tablet Take 1 tablet by mouth daily.     Probiotic Product (UP4 PROBIOTICS ADULT PO) Take by mouth.     VALERIAN ROOT PLUS PO Take by mouth.     No current facility-administered medications for this visit.    Patient confirms/reports the following allergies:  No Known Allergies  No orders of the defined types were placed in this encounter.   AUTHORIZATION INFORMATION Primary Insurance: 1D#: Group #:  Secondary Insurance: 1D#: Group #:  SCHEDULE INFORMATION: Date: 03/03/2022 Time: Location:[facility]

## 2022-02-26 NOTE — Telephone Encounter (Signed)
Patient needs office visit blood in stool

## 2022-03-03 ENCOUNTER — Ambulatory Visit
Admission: RE | Admit: 2022-03-03 | Discharge: 2022-03-03 | Disposition: A | Discharge status: Home / Self Care | Attending: Gastroenterology | Admitting: Gastroenterology

## 2022-03-03 ENCOUNTER — Ambulatory Visit: Admitting: Anesthesiology

## 2022-03-03 ENCOUNTER — Encounter: Payer: Self-pay | Admitting: Gastroenterology

## 2022-03-03 ENCOUNTER — Encounter
Admission: RE | Disposition: A | Discharge status: Home / Self Care | Payer: Self-pay | Source: Home / Self Care | Attending: Gastroenterology

## 2022-03-03 DIAGNOSIS — D649 Anemia, unspecified: Secondary | ICD-10-CM | POA: Diagnosis not present

## 2022-03-03 DIAGNOSIS — K635 Polyp of colon: Secondary | ICD-10-CM | POA: Diagnosis not present

## 2022-03-03 DIAGNOSIS — E782 Mixed hyperlipidemia: Secondary | ICD-10-CM

## 2022-03-03 DIAGNOSIS — Z1211 Encounter for screening for malignant neoplasm of colon: Secondary | ICD-10-CM | POA: Diagnosis present

## 2022-03-03 DIAGNOSIS — D759 Disease of blood and blood-forming organs, unspecified: Secondary | ICD-10-CM | POA: Diagnosis not present

## 2022-03-03 DIAGNOSIS — E669 Obesity, unspecified: Secondary | ICD-10-CM | POA: Insufficient documentation

## 2022-03-03 DIAGNOSIS — Z6836 Body mass index (BMI) 36.0-36.9, adult: Secondary | ICD-10-CM | POA: Diagnosis not present

## 2022-03-03 HISTORY — DX: Presence of spectacles and contact lenses: Z97.3

## 2022-03-03 HISTORY — PX: COLONOSCOPY WITH PROPOFOL: SHX5780

## 2022-03-03 HISTORY — PX: POLYPECTOMY: SHX5525

## 2022-03-03 HISTORY — DX: Anemia, unspecified: D64.9

## 2022-03-03 SURGERY — COLONOSCOPY WITH PROPOFOL
Anesthesia: General | Site: Rectum

## 2022-03-03 MED ORDER — ACETAMINOPHEN 160 MG/5ML PO SOLN: 325.0000 mg | ORAL | Status: DC | PRN

## 2022-03-03 MED ORDER — STERILE WATER FOR IRRIGATION IR SOLN
Status: DC | PRN
  Administered 2022-03-03: 1

## 2022-03-03 MED ORDER — ONDANSETRON HCL 4 MG/2ML IJ SOLN: 4.0000 mg | Freq: Once | INTRAMUSCULAR | Status: DC | PRN

## 2022-03-03 MED ORDER — PROPOFOL 10 MG/ML IV BOLUS
INTRAVENOUS | Status: DC | PRN
  Administered 2022-03-03: 25 mg via INTRAVENOUS
  Administered 2022-03-03: 50 mg via INTRAVENOUS
  Administered 2022-03-03: 30 mg via INTRAVENOUS
  Administered 2022-03-03: 50 mg via INTRAVENOUS
  Administered 2022-03-03: 70 mg via INTRAVENOUS
  Administered 2022-03-03: 5 mg via INTRAVENOUS

## 2022-03-03 MED ORDER — ACETAMINOPHEN 325 MG PO TABS: 650.0000 mg | ORAL_TABLET | ORAL | Status: DC | PRN

## 2022-03-03 MED ORDER — LACTATED RINGERS IV SOLN: INTRAVENOUS | Status: DC

## 2022-03-03 MED ORDER — SODIUM CHLORIDE 0.9 % IV SOLN: INTRAVENOUS | Status: DC

## 2022-03-03 MED ORDER — LIDOCAINE HCL (CARDIAC) PF 100 MG/5ML IV SOSY
PREFILLED_SYRINGE | INTRAVENOUS | Status: DC | PRN
  Administered 2022-03-03: 40 mg via INTRAVENOUS

## 2022-03-03 SURGICAL SUPPLY — 26 items
CLIP HMST 235XBRD CATH ROT (MISCELLANEOUS) IMPLANT
CLIP RESOLUTION 360 11X235 (MISCELLANEOUS)
ELECT REM PT RETURN 9FT ADLT (ELECTROSURGICAL)
ELECTRODE REM PT RTRN 9FT ADLT (ELECTROSURGICAL) IMPLANT
FCP ESCP3.2XJMB 240X2.8X (MISCELLANEOUS)
FORCEPS BIOP RAD 4 LRG CAP 4 (CUTTING FORCEPS) ×1 IMPLANT
FORCEPS BIOP RJ4 240 W/NDL (MISCELLANEOUS)
FORCEPS ESCP3.2XJMB 240X2.8X (MISCELLANEOUS) IMPLANT
GOWN CVR UNV OPN BCK APRN NK (MISCELLANEOUS) ×4 IMPLANT
GOWN ISOL THUMB LOOP REG UNIV (MISCELLANEOUS) ×6
INJECTOR VARIJECT VIN23 (MISCELLANEOUS) IMPLANT
KIT DEFENDO VALVE AND CONN (KITS) IMPLANT
KIT PRC NS LF DISP ENDO (KITS) ×2 IMPLANT
KIT PROCEDURE OLYMPUS (KITS) ×3
MANIFOLD NEPTUNE II (INSTRUMENTS) ×3 IMPLANT
MARKER SPOT ENDO TATTOO 5ML (MISCELLANEOUS) IMPLANT
PROBE APC STR FIRE (PROBE) IMPLANT
RETRIEVER NET ROTH 2.5X230 LF (MISCELLANEOUS) IMPLANT
SNARE COLD EXACTO (MISCELLANEOUS) IMPLANT
SNARE SHORT THROW 13M SML OVAL (MISCELLANEOUS) IMPLANT
SNARE SHORT THROW 30M LRG OVAL (MISCELLANEOUS) IMPLANT
SNARE SNG USE RND 15MM (INSTRUMENTS) IMPLANT
SPOT EX ENDOSCOPIC TATTOO (MISCELLANEOUS)
TRAP ETRAP POLY (MISCELLANEOUS) IMPLANT
VARIJECT INJECTOR VIN23 (MISCELLANEOUS)
WATER STERILE IRR 250ML POUR (IV SOLUTION) ×3 IMPLANT

## 2022-03-03 NOTE — Transfer of Care (Signed)
Immediate Anesthesia Transfer of Care Note  Patient: Jessica Walter  Procedure(s) Performed: COLONOSCOPY WITH PROPOFOL (Rectum) POLYPECTOMY (Rectum)  Patient Location: PACU  Anesthesia Type: General  Level of Consciousness: awake, alert  and patient cooperative  Airway and Oxygen Therapy: Patient Spontanous Breathing and Patient connected to supplemental oxygen  Post-op Assessment: Post-op Vital signs reviewed, Patient's Cardiovascular Status Stable, Respiratory Function Stable, Patent Airway and No signs of Nausea or vomiting  Post-op Vital Signs: Reviewed and stable  Complications: No notable events documented.

## 2022-03-03 NOTE — Anesthesia Postprocedure Evaluation (Signed)
Anesthesia Post Note  Patient: Jessica Walter  Procedure(s) Performed: COLONOSCOPY WITH PROPOFOL (Rectum) POLYPECTOMY (Rectum)     Patient location during evaluation: PACU Anesthesia Type: General Level of consciousness: awake and alert Pain management: pain level controlled Vital Signs Assessment: post-procedure vital signs reviewed and stable Respiratory status: spontaneous breathing, nonlabored ventilation, respiratory function stable and patient connected to nasal cannula oxygen Cardiovascular status: blood pressure returned to baseline and stable Postop Assessment: no apparent nausea or vomiting Anesthetic complications: no   No notable events documented.  Daniah Zaldivar A  Julena Barbour

## 2022-03-03 NOTE — Anesthesia Preprocedure Evaluation (Signed)
Anesthesia Evaluation  Patient identified by MRN, date of birth, ID band Patient awake    Reviewed: Allergy & Precautions, NPO status , Patient's Chart, lab work & pertinent test results, reviewed documented beta blocker date and time   History of Anesthesia Complications Negative for: history of anesthetic complications  Airway Mallampati: III  TM Distance: >3 FB Neck ROM: Limited    Dental   Pulmonary    breath sounds clear to auscultation       Cardiovascular (-) angina(-) DOE  Rhythm:Regular Rate:Normal   HLD   Neuro/Psych PSYCHIATRIC DISORDERS Anxiety    GI/Hepatic GERD  ,  Endo/Other    Renal/GU      Musculoskeletal   Abdominal (+) + obese (BMI 36),   Peds  Hematology  (+) Blood dyscrasia, anemia ,   Anesthesia Other Findings   Reproductive/Obstetrics                            Anesthesia Physical Anesthesia Plan  ASA: 2  Anesthesia Plan: General   Post-op Pain Management:    Induction: Intravenous  PONV Risk Score and Plan: 3 and Propofol infusion, TIVA and Treatment may vary due to age or medical condition  Airway Management Planned: Natural Airway and Nasal Cannula  Additional Equipment:   Intra-op Plan:   Post-operative Plan:   Informed Consent: I have reviewed the patients History and Physical, chart, labs and discussed the procedure including the risks, benefits and alternatives for the proposed anesthesia with the patient or authorized representative who has indicated his/her understanding and acceptance.       Plan Discussed with: CRNA and Anesthesiologist  Anesthesia Plan Comments:         Anesthesia Quick Evaluation

## 2022-03-03 NOTE — Op Note (Signed)
[facility] Patient Name: Jessica Walter Procedure Date: 03/03/2022 11:43 AM MRN: [id number] Account #: [id number] Date of Birth: Apr 05, 1961 Admit Type: Outpatient Age: 61 Room: [facility] OR ROOM 01 Gender: Female Note Status: Finalized Instrument Name: [id number] Procedure:             Colonoscopy Indications:           Screening for colorectal malignant neoplasm, This is                         the patient's first colonoscopy Providers:             Lin Landsman MD, MD Referring MD:          Halina Maidens, MD (Referring MD) Medicines:             General Anesthesia Complications:         No immediate complications. Estimated blood loss: None. Procedure:             Pre-Anesthesia Assessment:                        - Prior to the procedure, a History and Physical was                         performed, and patient medications and allergies were                         reviewed. The patient is competent. The risks and                         benefits of the procedure and the sedation options and                         risks were discussed with the patient. All questions                         were answered and informed consent was obtained.                         Patient identification and proposed procedure were                         verified by the physician, the nurse, the                         anesthesiologist, the anesthetist and the technician                         in the pre-procedure area in the procedure room in the                         endoscopy suite. Mental Status Examination: alert and                         oriented. Airway Examination: normal oropharyngeal                         airway and neck mobility. Respiratory Examination:  clear to auscultation. CV Examination: normal.                         Prophylactic Antibiotics: The patient does not require                         prophylactic  antibiotics. Prior Anticoagulants: The                         patient has taken no previous anticoagulant or                         antiplatelet agents. ASA Grade Assessment: II - A                         patient with mild systemic disease. After reviewing                         the risks and benefits, the patient was deemed in                         satisfactory condition to undergo the procedure. The                         anesthesia plan was to use general anesthesia.                         Immediately prior to administration of medications,                         the patient was re-assessed for adequacy to receive                         sedatives. The heart rate, respiratory rate, oxygen                         saturations, blood pressure, adequacy of pulmonary                         ventilation, and response to care were monitored                         throughout the procedure. The physical status of the                         patient was re-assessed after the procedure.                        After obtaining informed consent, the colonoscope was                         passed under direct vision. Throughout the procedure,                         the patient's blood pressure, pulse, and oxygen                         saturations were monitored continuously. The  Colonoscope was introduced through the anus and                         advanced to the the terminal ileum, with                         identification of the appendiceal orifice and IC                         valve. The colonoscopy was performed without                         difficulty. The patient tolerated the procedure well.                         The quality of the bowel preparation was evaluated                         using the BBPS [facility] Bowel Preparation Scale) with                         scores of: Right Colon = 3, Transverse Colon = 3 and                         Left Colon =  3 (entire mucosa seen well with no                         residual staining, small fragments of stool or opaque                         liquid). The total BBPS score equals 9. Findings:      The perianal and digital rectal examinations were normal. Pertinent       negatives include normal sphincter tone and no palpable rectal lesions.      The terminal ileum appeared normal.      A diminutive polyp was found in the proximal ascending colon. The polyp       was sessile. The polyp was removed with a jumbo cold forceps. Resection       and retrieval were complete.      The retroflexed view of the distal rectum and anal verge was normal and       showed no anal or rectal abnormalities.      The exam was otherwise without abnormality. Impression:            - The examined portion of the ileum was normal.                        - One diminutive polyp in the proximal ascending                         colon, removed with a jumbo cold forceps. Resected and                         retrieved.                        - The distal rectum and anal verge are normal on  retroflexion view.                        - The examination was otherwise normal. Recommendation:        - Discharge patient to home (with escort).                        - Resume previous diet today.                        - Continue present medications.                        - Await pathology results.                        - Repeat colonoscopy in 7-10 years for surveillance                         based on pathology results. Procedure Code(s):     --- Professional ---                        [phone], Colonoscopy, flexible; with biopsy, single or                         multiple Diagnosis Code(s):     --- Professional ---                        Z12.11, Encounter for screening for malignant neoplasm                         of colon                        K63.5, Polyp of colon CPT copyright 2019 American Medical  Association. All rights reserved. The codes documented in this report are preliminary and upon coder review may  be revised to meet current compliance requirements. Dr. Ulyess Mort Lin Landsman MD, MD 03/03/2022 12:10:00 PM This report has been signed electronically. Number of Addenda: 0 Note Initiated On: 03/03/2022 11:43 AM Scope Withdrawal Time: 0 hours 11 minutes 29 seconds  Total Procedure Duration: 0 hours 15 minutes 30 seconds  Estimated Blood Loss:  Estimated blood loss: none.      [facility]

## 2022-03-03 NOTE — H&P (Signed)
Jessica Darby, MD [address]  [place]  [place], [place] [address]  Main: [phone]  Fax: [phone] Pager: [phone]  Primary Care Physician:  Glean Hess, MD Primary Gastroenterologist:  Dr. Cephas Walter  Pre-Procedure History & Physical: HPI:  Jessica Walter is a 61 y.o. female is here for an colonoscopy.   Past Medical History:  Diagnosis Date   Anemia    Prior to menopause   Anxiety    Gastroesophageal reflux disease 10/15/2020   GERD (gastroesophageal reflux disease)    Vitamin B deficiency    Vitamin D deficiency    Wears contact lenses    sometimes    Past Surgical History:  Procedure Laterality Date   BREAST BIOPSY Right 01/07/2021   affirm bx distortion, ribbon marker, path pending    Prior to Admission medications   Medication Sig Start Date End Date Taking? Authorizing Provider  Cholecalciferol (VITAMIN D-3) 125 MCG (5000 UT) TABS Take by mouth.   Yes [provider]  loratadine (CLARITIN) 10 MG tablet Take 10 mg by mouth daily as needed for allergies.   Yes [provider]  Melatonin 10 MG TABS Take 3 mg by mouth at bedtime as needed. Kathie Rhodes   Yes [provider]  Multiple Vitamins-Minerals (MULTIVITAMIN WITH MINERALS) tablet Take 1 tablet by mouth daily.   Yes [provider]  Probiotic Product (UP4 PROBIOTICS ADULT PO) Take by mouth.   Yes [provider]  VALERIAN ROOT PLUS PO Take by mouth.   Yes [provider]    Allergies as of 02/26/2022   (No Known Allergies)    Family History  Problem Relation Age of Onset   Heart attack Mother    Heart disease Mother    Diabetes Mother    Heart disease Father    Cancer Brother    Breast cancer Neg Hx     Social History   Socioeconomic History   Marital status: Married    Spouse name: Not on file   Number of children: 0   Years of education: Not on file   Highest education level: Not on file  Occupational History    Not on file  Tobacco Use   Smoking status: Never   Smokeless tobacco: Never  Vaping Use   Vaping Use: Never used  Substance and Sexual Activity   Alcohol use: Yes    Comment: rare   Drug use: Never   Sexual activity: Yes  Other Topics Concern   Not on file  Social History Narrative   Not on file   Social Determinants of Health   Financial Resource Strain: Not on file  Food Insecurity: Not on file  Transportation Needs: Not on file  Physical Activity: Not on file  Stress: Not on file  Social Connections: Not on file  Intimate Partner Violence: Not on file    Review of Systems: See HPI, otherwise negative ROS  Physical Exam: BP 132/77   Pulse 63   Temp 97.8 F (36.6 C) (Temporal)   Ht 5' 6.5" (1.689 m)   Wt 103.4 kg   SpO2 97%   BMI 36.25 kg/m  General:   Alert,  pleasant and cooperative in NAD Head:  Normocephalic and atraumatic. Neck:  Supple; no masses or thyromegaly. Lungs:  Clear throughout to auscultation.    Heart:  Regular rate and rhythm. Abdomen:  Soft, nontender and nondistended. Normal bowel sounds, without guarding, and without rebound.   Neurologic:  Alert and  oriented x4;  grossly normal neurologically.  Impression/Plan: Jessica Walter is here for an colonoscopy to be performed for occult blood in stool  Risks, benefits, limitations, and alternatives regarding  colonoscopy have been reviewed with the patient.  Questions have been answered.  All parties agreeable.   Sherri Sear, MD  03/03/2022, 10:26 AM

## 2022-03-04 ENCOUNTER — Encounter: Payer: Self-pay | Admitting: Gastroenterology

## 2022-03-05 LAB — SURGICAL PATHOLOGY

## 2022-03-06 ENCOUNTER — Encounter: Payer: Self-pay | Admitting: Gastroenterology

## 2022-03-18 ENCOUNTER — Ambulatory Visit (INDEPENDENT_AMBULATORY_CARE_PROVIDER_SITE_OTHER)

## 2022-03-18 DIAGNOSIS — Z23 Encounter for immunization: Secondary | ICD-10-CM | POA: Diagnosis not present

## 2022-05-12 IMAGING — MG MM DIGITAL SCREENING BILAT W/ TOMO AND CAD
6 of 12 series · 6 of 36 positions shown · non-contrast
Comparison: Previous exam(s).

CLINICAL DATA: Screening.

EXAM:
DIGITAL SCREENING BILATERAL MAMMOGRAM WITH TOMOSYNTHESIS AND CAD
TECHNIQUE: Bilateral screening digital craniocaudal and mediolateral oblique
mammograms were obtained. Bilateral screening digital breast
tomosynthesis was performed. The images were evaluated with
computer-aided detection.

[L CC synth-2D (1 of 2)]
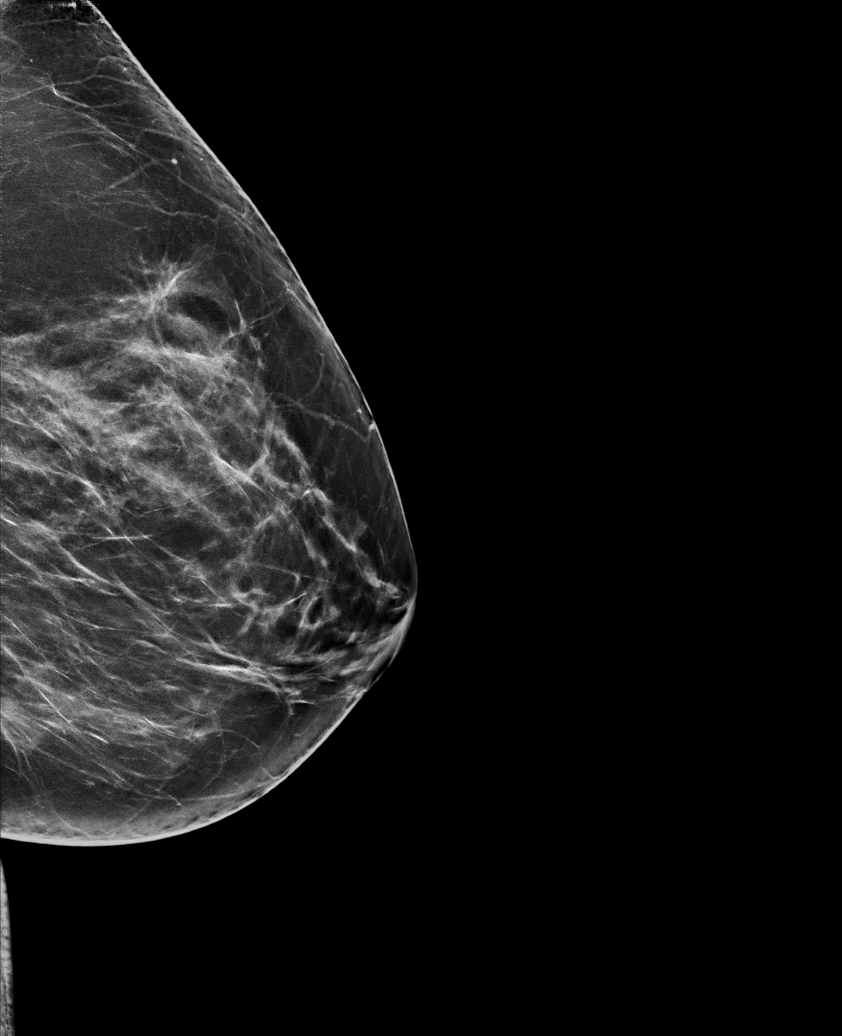

[L CC synth-2D (2 of 2)]
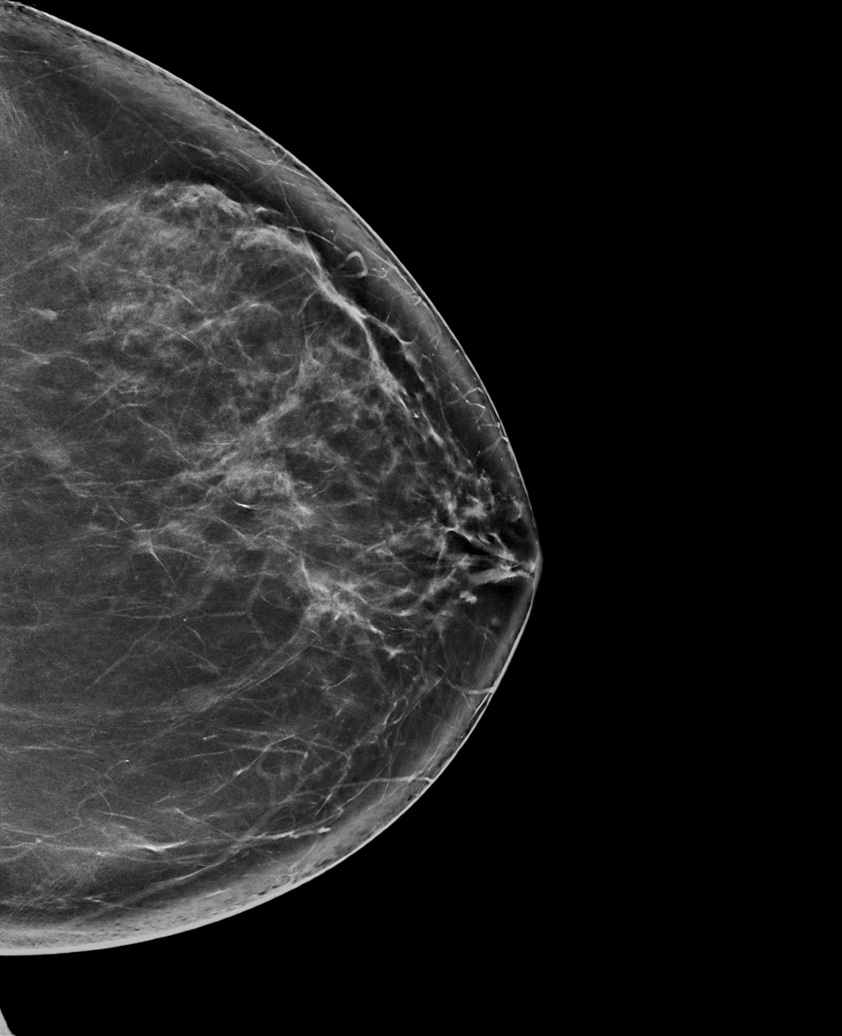

[L MLO synth-2D]
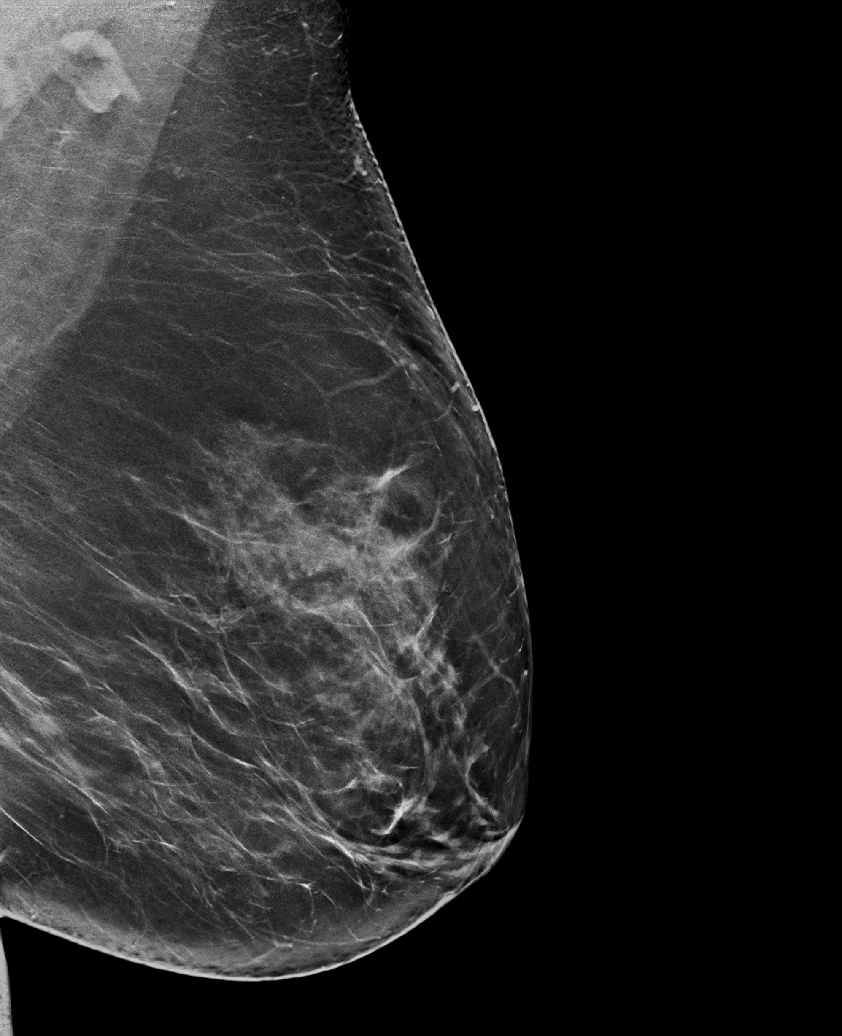

[R MLO synth-2D]
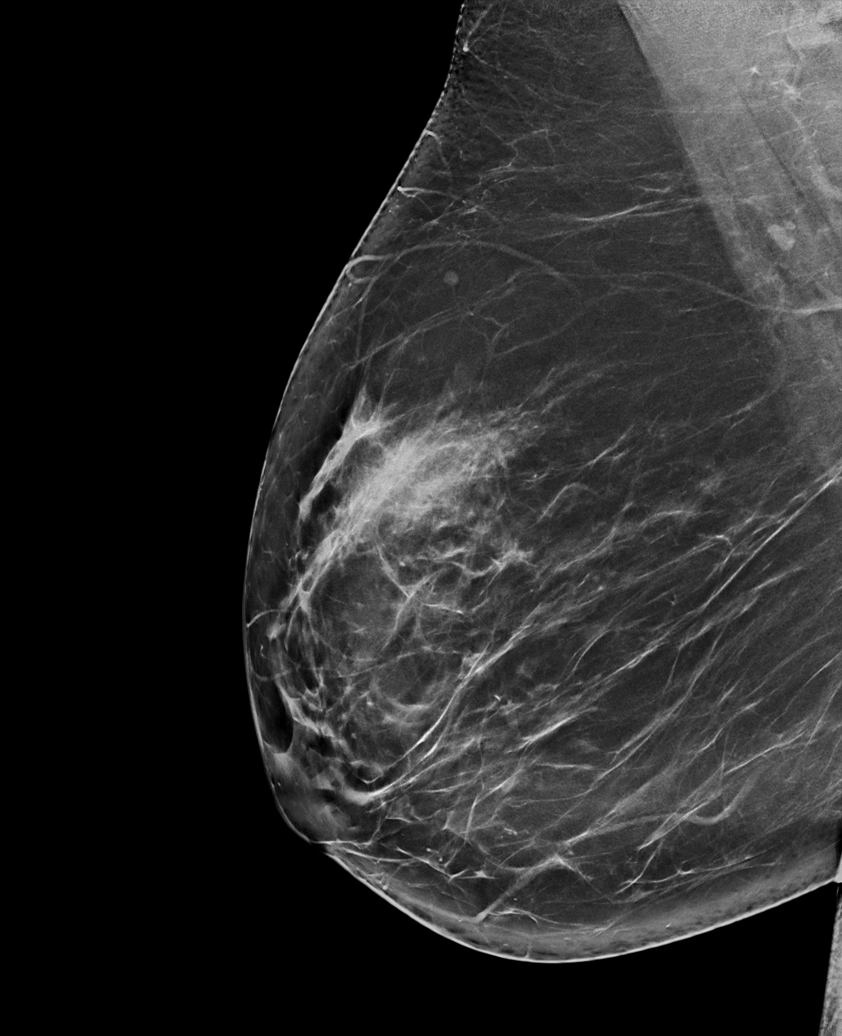

[R CC synth-2D (1 of 2)]
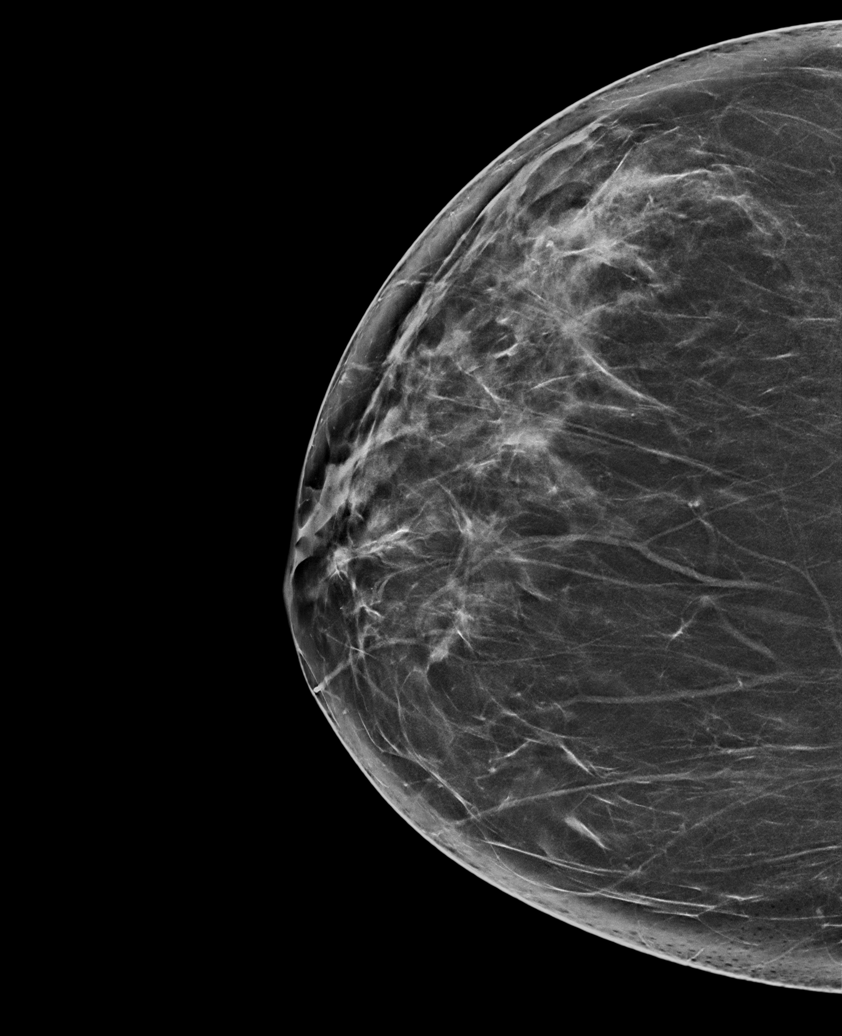

[R CC synth-2D (2 of 2)]
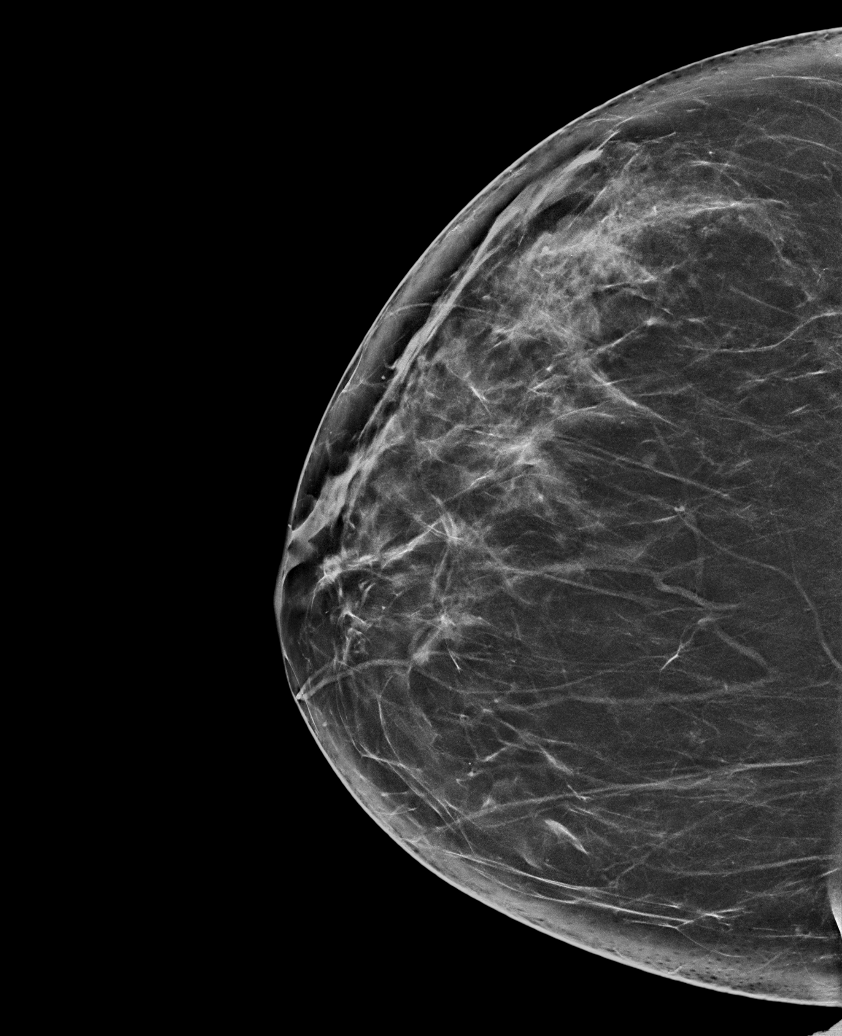

[6 of 36 positions shown; findings below may reference images not displayed]

ACR Breast Density Category c: The breast tissue is heterogeneously
dense, which may obscure small masses.
FINDINGS: In the right breast, possible distortion warrants further
evaluation. In the left breast, no findings suspicious for
malignancy.
IMPRESSION: Further evaluation is suggested for possible distortion in the right
breast.

RECOMMENDATION:
Diagnostic mammogram and possibly ultrasound of the right breast.
(Code:56-2-XX1)

The patient will be contacted regarding the findings, and additional
imaging will be scheduled.

BI-RADS CATEGORY  0: Incomplete. Need additional imaging evaluation
and/or prior mammograms for comparison.

## 2022-06-18 ENCOUNTER — Ambulatory Visit (INDEPENDENT_AMBULATORY_CARE_PROVIDER_SITE_OTHER)

## 2022-06-18 DIAGNOSIS — Z23 Encounter for immunization: Secondary | ICD-10-CM | POA: Diagnosis not present

## 2022-06-18 MED ORDER — SHINGRIX 50 MCG/0.5ML IM SUSR: 0.5000 mL | Freq: Once | INTRAMUSCULAR | 0 refills | Status: DC

## 2022-11-11 ENCOUNTER — Other Ambulatory Visit: Payer: Self-pay | Admitting: Internal Medicine

## 2022-11-11 DIAGNOSIS — N6489 Other specified disorders of breast: Secondary | ICD-10-CM

## 2022-12-29 ENCOUNTER — Ambulatory Visit
Admission: RE | Admit: 2022-12-29 | Discharge: 2022-12-29 | Disposition: A | Discharge status: Home / Self Care | Source: Ambulatory Visit | Attending: Internal Medicine | Admitting: Internal Medicine

## 2022-12-29 DIAGNOSIS — N6489 Other specified disorders of breast: Secondary | ICD-10-CM | POA: Insufficient documentation

## 2023-02-22 ENCOUNTER — Ambulatory Visit (INDEPENDENT_AMBULATORY_CARE_PROVIDER_SITE_OTHER): Admitting: Internal Medicine

## 2023-02-22 ENCOUNTER — Ambulatory Visit
Admission: RE | Admit: 2023-02-22 | Discharge: 2023-02-22 | Disposition: A | Discharge status: Home / Self Care | Source: Ambulatory Visit | Attending: Internal Medicine | Admitting: Internal Medicine

## 2023-02-22 ENCOUNTER — Ambulatory Visit
Admission: RE | Admit: 2023-02-22 | Discharge: 2023-02-22 | Disposition: A | Discharge status: Home / Self Care | Attending: Internal Medicine | Admitting: Internal Medicine

## 2023-02-22 ENCOUNTER — Encounter: Payer: Self-pay | Admitting: Internal Medicine

## 2023-02-22 VITALS — BP 122/66 | HR 82 | Ht 66.5 in | Wt 239.8 lb

## 2023-02-22 DIAGNOSIS — M25551 Pain in right hip: Secondary | ICD-10-CM | POA: Insufficient documentation

## 2023-02-22 DIAGNOSIS — M25552 Pain in left hip: Secondary | ICD-10-CM | POA: Diagnosis present

## 2023-02-22 DIAGNOSIS — E538 Deficiency of other specified B group vitamins: Secondary | ICD-10-CM | POA: Diagnosis not present

## 2023-02-22 DIAGNOSIS — Z23 Encounter for immunization: Secondary | ICD-10-CM | POA: Diagnosis not present

## 2023-02-22 DIAGNOSIS — Z0000 Encounter for general adult medical examination without abnormal findings: Secondary | ICD-10-CM

## 2023-02-22 DIAGNOSIS — E559 Vitamin D deficiency, unspecified: Secondary | ICD-10-CM | POA: Diagnosis not present

## 2023-02-22 DIAGNOSIS — F4323 Adjustment disorder with mixed anxiety and depressed mood: Secondary | ICD-10-CM | POA: Insufficient documentation

## 2023-02-22 DIAGNOSIS — E782 Mixed hyperlipidemia: Secondary | ICD-10-CM

## 2023-02-22 NOTE — Assessment & Plan Note (Signed)
Resolved -  Lab Results  Component Value Date   VITAMINB12 1,196 02/17/2022

## 2023-02-22 NOTE — Assessment & Plan Note (Signed)
Supplemented Last vitamin D Lab Results  Component Value Date   VD25OH 38.0 10/15/2020

## 2023-02-22 NOTE — Assessment & Plan Note (Signed)
Patient has been dealing with grief/loss of multiple family members and ongoing stressor from her sister She is interested in counseling services but is having trouble finding a provider

## 2023-02-22 NOTE — Assessment & Plan Note (Signed)
Lipids managed with diet and lifestyle changes Lab Results  Component Value Date   LDLCALC 144 (H) 02/17/2022

## 2023-02-22 NOTE — Progress Notes (Signed)
Date:  02/22/2023   Name:  Jessica Walter   DOB:  1961/06/15   MRN:  [id number]   Chief Complaint: Annual Exam (Breast Exam.), Hip Pain (Patient complaining of muscles around the left hip being in pain. X 2 months. Wants referral for PT.), and Anxiety (& Depression. Wants a referral to psychiatrist. ) Jessica Walter is a 62 y.o. female who presents today for her Complete Annual Exam. She feels fairly well. She reports exercising. She reports she is sleeping poorly. Breast complaints - none.  Mammogram: 12/2022 DEXA: none Pap smear: 01/2021 neg thin prep Colonoscopy: 02/2022 repeat 7 yrs  Health Maintenance Due  Topic Date Due   COVID-19 Vaccine (4 - 2023-24 season) 04/24/2022    Immunization History  Administered Date(s) Administered   Influenza-Unspecified 04/25/2020, 06/10/2021, 05/29/2022   Moderna Sars-Covid-2 Vaccination 11/15/2019, 12/13/2019, 07/24/2020   Tdap 02/22/2023   Zoster Recombinant(Shingrix) 03/18/2022, 06/18/2022    Depression        This is a recurrent problem.  The onset quality is undetermined.   Associated symptoms include decreased concentration, hopelessness, insomnia, restlessness, decreased interest and appetite change.  Associated symptoms include no fatigue, no headaches and no suicidal ideas.     The symptoms are aggravated by family issues. Hip Pain  There was no injury mechanism. The pain is present in the left hip. The quality of the pain is described as aching. The pain is moderate. The pain has been Fluctuating since onset.    Lab Results  Component Value Date   NA 140 02/17/2022   K 4.9 02/17/2022   CO2 22 02/17/2022   GLUCOSE 102 (H) 02/17/2022   BUN 12 02/17/2022   CREATININE 0.75 02/17/2022   CALCIUM 9.2 02/17/2022   EGFR 91 02/17/2022   Lab Results  Component Value Date   CHOL 224 (H) 02/17/2022   HDL 63 02/17/2022   LDLCALC 144 (H) 02/17/2022   TRIG 94 02/17/2022   CHOLHDL 3.6 02/17/2022   Lab Results  Component Value Date   TSH  1.330 02/17/2022   Lab Results  Component Value Date   HGBA1C 5.5 02/17/2022   Lab Results  Component Value Date   WBC 6.9 02/17/2022   HGB 14.7 02/17/2022   HCT 43.9 02/17/2022   MCV 90 02/17/2022   PLT 343 02/17/2022   Lab Results  Component Value Date   ALT 15 02/17/2022   AST 16 02/17/2022   ALKPHOS 87 02/17/2022   BILITOT 0.4 02/17/2022   Lab Results  Component Value Date   VD25OH 38.0 10/15/2020     Review of Systems  Constitutional:  Positive for appetite change. Negative for chills, fatigue and fever.  HENT:  Negative for congestion, hearing loss, tinnitus, trouble swallowing and voice change.   Eyes:  Negative for visual disturbance.  Respiratory:  Negative for cough, chest tightness, shortness of breath and wheezing.   Cardiovascular:  Negative for chest pain, palpitations and leg swelling.  Gastrointestinal:  Negative for abdominal pain, constipation, diarrhea and vomiting.  Endocrine: Negative for polydipsia and polyuria.  Genitourinary:  Negative for dysuria, frequency, genital sores, vaginal bleeding and vaginal discharge.  Musculoskeletal:  Positive for arthralgias. Negative for gait problem and joint swelling.  Skin:  Negative for color change and rash.  Neurological:  Negative for dizziness, tremors, light-headedness and headaches.  Hematological:  Negative for adenopathy. Does not bruise/bleed easily.  Psychiatric/Behavioral:  Positive for decreased concentration, depression and sleep disturbance. Negative for dysphoric mood and suicidal ideas. The patient  is nervous/anxious and has insomnia.     Patient Active Problem List   Diagnosis Date Noted   Adjustment reaction with anxiety and depression 02/22/2023   Pain of left hip 02/22/2023   Polyp of ascending colon    Environmental and seasonal allergies 02/13/2021   Neoplasm of uncertain behavior of skin 02/13/2021   Vitamin B12 deficiency 10/15/2020   Vitamin D deficiency 10/15/2020   Mixed  hyperlipidemia 10/15/2020   Obesity (BMI 30-39.9) 10/15/2020    No Known Allergies  Past Surgical History:  Procedure Laterality Date   BREAST BIOPSY Right 01/07/2021   affirm bx distortion, ribbon marker, path pending   COLONOSCOPY WITH PROPOFOL N/A 03/03/2022   Procedure: COLONOSCOPY WITH PROPOFOL;  Surgeon: Toney Reil, MD;  Location: [facility];  Service: Endoscopy;  Laterality: N/A;   POLYPECTOMY  03/03/2022   Procedure: POLYPECTOMY;  Surgeon: Toney Reil, MD;  Location: [facility];  Service: Endoscopy;;    Social History   Tobacco Use   Smoking status: Never   Smokeless tobacco: Never  Vaping Use   Vaping Use: Never used  Substance Use Topics   Alcohol use: Yes    Comment: rare   Drug use: Never     Medication list has been reviewed and updated.  Current Meds  Medication Sig   Cholecalciferol (VITAMIN D-3) 125 MCG (5000 UT) TABS Take by mouth.   loratadine (CLARITIN) 10 MG tablet Take 10 mg by mouth daily as needed for allergies.   Melatonin 10 MG TABS Take 3 mg by mouth at bedtime as needed. Olly Brand   Multiple Vitamins-Minerals (MULTIVITAMIN WITH MINERALS) tablet Take 1 tablet by mouth daily.   Probiotic Product (UP4 PROBIOTICS ADULT PO) Take by mouth.   VALERIAN ROOT PLUS PO Take by mouth.       02/22/2023    8:26 AM 02/17/2022    8:38 AM 02/13/2021    9:14 AM 10/15/2020    3:23 PM  GAD 7 : Generalized Anxiety Score  Nervous, Anxious, on Edge 3 3 2 1   Control/stop worrying 3 2 2 1   Worry too much - different things 3 3 2 1   Trouble relaxing 3 2 1 2   Restless 3 3 1 1   Easily annoyed or irritable 3 2 0 0  Afraid - awful might happen 0 2 3 1   Total GAD 7 Score 18 17 11 7   Anxiety Difficulty Very difficult Very difficult Very difficult Not difficult at all       02/22/2023    8:26 AM 02/17/2022    8:38 AM 02/13/2021    9:13 AM  Depression screen PHQ 2/9  Decreased Interest 0 1 1  Down, Depressed, Hopeless 0 1 1  PHQ - 2  Score 0 2 2  Altered sleeping 3 3 2   Tired, decreased energy 3 2 0  Change in appetite 3 2 1   Feeling bad or failure about yourself  0 2 1  Trouble concentrating 3 3 1   Moving slowly or fidgety/restless 0 2 3  Suicidal thoughts 0 0 0  PHQ-9 Score 12 16 10   Difficult doing work/chores Somewhat difficult Somewhat difficult Very difficult    BP Readings from Last 3 Encounters:  02/22/23 122/66  03/03/22 138/71  02/17/22 140/86    Physical Exam Vitals and nursing note reviewed.  Constitutional:      General: She is not in acute distress.    Appearance: She is well-developed.  HENT:     Head:  Normocephalic and atraumatic.     Right Ear: Tympanic membrane and ear canal normal.     Left Ear: Tympanic membrane and ear canal normal.     Nose:     Right Sinus: No maxillary sinus tenderness.     Left Sinus: No maxillary sinus tenderness.  Eyes:     General: No scleral icterus.       Right eye: No discharge.        Left eye: No discharge.     Conjunctiva/sclera: Conjunctivae normal.  Neck:     Thyroid: No thyromegaly.     Vascular: No carotid bruit.  Cardiovascular:     Rate and Rhythm: Normal rate and regular rhythm.     Pulses: Normal pulses.     Heart sounds: Normal heart sounds.  Pulmonary:     Effort: Pulmonary effort is normal. No respiratory distress.     Breath sounds: No wheezing.  Abdominal:     General: Bowel sounds are normal.     Palpations: Abdomen is soft.     Tenderness: There is no abdominal tenderness.  Musculoskeletal:     Cervical back: Normal range of motion. No erythema.     Lumbar back: Normal. Negative right straight leg raise test and negative left straight leg raise test.     Right hip: No tenderness or bony tenderness. Normal range of motion.     Left hip: Tenderness present. No bony tenderness. Normal range of motion.     Right knee: Normal.     Left knee: Normal.     Right lower leg: No edema.     Left lower leg: No edema.  Lymphadenopathy:      Cervical: No cervical adenopathy.  Skin:    General: Skin is warm and dry.     Capillary Refill: Capillary refill takes less than 2 seconds.     Findings: No rash.  Neurological:     Mental Status: She is alert and oriented to person, place, and time.     Cranial Nerves: No cranial nerve deficit.     Sensory: No sensory deficit.     Deep Tendon Reflexes: Reflexes are normal and symmetric.  Psychiatric:        Attention and Perception: Attention normal.        Mood and Affect: Mood normal.     Wt Readings from Last 3 Encounters:  02/22/23 239 lb 12.8 oz (108.8 kg)  03/03/22 228 lb (103.4 kg)  02/17/22 233 lb (105.7 kg)    BP 122/66   Pulse 82   Ht 5' 6.5" (1.689 m)   Wt 239 lb 12.8 oz (108.8 kg)   SpO2 97%   BMI 38.12 kg/m   Assessment and Plan:  Problem List Items Addressed This Visit     Vitamin D deficiency    Supplemented Last vitamin D Lab Results  Component Value Date   VD25OH 38.0 10/15/2020        Relevant Orders   VITAMIN D 25 Hydroxy (Vit-D Deficiency, Fractures)   Vitamin B12 deficiency    Resolved -  Lab Results  Component Value Date   VITAMINB12 1,196 02/17/2022        Pain of left hip   Relevant Orders   DG Hip Unilat W OR W/O Pelvis 2-3 Views Left   Mixed hyperlipidemia (Chronic)    Lipids managed with diet and lifestyle changes Lab Results  Component Value Date   LDLCALC 144 (H) 02/17/2022  Relevant Orders   Lipid panel   Adjustment reaction with anxiety and depression    Patient has been dealing with grief/loss of multiple family members and ongoing stressor from her sister She is interested in counseling services but is having trouble finding a provider      Relevant Orders   TSH   Other Visit Diagnoses     Annual physical exam    -  Primary   Relevant Orders   CBC with Differential/Platelet   Comprehensive metabolic panel   Lipid panel   VITAMIN D 25 Hydroxy (Vit-D Deficiency, Fractures)   TSH   Need for  diphtheria-tetanus-pertussis (Tdap) vaccine       Relevant Orders   Tdap vaccine greater than or equal to 7yo IM (Completed)       Return in about 1 year (around 02/22/2024) for CPX.   Partially dictated using Dragon software, any errors are not intentional.  Reubin Milan, MD [facility] Primary Care and Sports Medicine [place], [place]

## 2023-02-22 NOTE — Patient Instructions (Signed)
[facility] [place] and [place]

## 2023-02-23 LAB — CBC WITH DIFFERENTIAL/PLATELET
Basophils Absolute: 0.1 10*3/uL (ref 0.0–0.2)
Basos: 1 %
EOS (ABSOLUTE): 0.1 10*3/uL (ref 0.0–0.4)
Eos: 2 %
Hematocrit: 42.4 % (ref 34.0–46.6)
Hemoglobin: 14.1 g/dL (ref 11.1–15.9)
Immature Grans (Abs): 0 10*3/uL (ref 0.0–0.1)
Immature Granulocytes: 0 %
Lymphocytes Absolute: 2 10*3/uL (ref 0.7–3.1)
Lymphs: 32 %
MCH: 30.5 pg (ref 26.6–33.0)
MCHC: 33.3 g/dL (ref 31.5–35.7)
MCV: 92 fL (ref 79–97)
Monocytes Absolute: 0.3 10*3/uL (ref 0.1–0.9)
Monocytes: 5 %
Neutrophils Absolute: 3.7 10*3/uL (ref 1.4–7.0)
Neutrophils: 60 %
Platelets: 321 10*3/uL (ref 150–450)
RBC: 4.62 x10E6/uL (ref 3.77–5.28)
RDW: 12.6 % (ref 11.7–15.4)
WBC: 6.2 10*3/uL (ref 3.4–10.8)

## 2023-02-23 LAB — LIPID PANEL
Chol/HDL Ratio: 3.5 ratio (ref 0.0–4.4)
Cholesterol, Total: 234 mg/dL — ABNORMAL HIGH (ref 100–199)
HDL: 67 mg/dL (ref >39)
LDL Chol Calc (NIH): 145 mg/dL — ABNORMAL HIGH (ref 0–99)
Triglycerides: 123 mg/dL (ref 0–149)
VLDL Cholesterol Cal: 22 mg/dL (ref 5–40)

## 2023-02-23 LAB — COMPREHENSIVE METABOLIC PANEL
ALT: 20 IU/L (ref 0–32)
AST: 20 IU/L (ref 0–40)
Albumin: 4.6 g/dL (ref 3.9–4.9)
Alkaline Phosphatase: 85 IU/L (ref 44–121)
BUN/Creatinine Ratio: 19 (ref 12–28)
BUN: 15 mg/dL (ref 8–27)
Bilirubin Total: 0.4 mg/dL (ref 0.0–1.2)
CO2: 24 mmol/L (ref 20–29)
Calcium: 9.4 mg/dL (ref 8.7–10.3)
Chloride: 99 mmol/L (ref 96–106)
Creatinine, Ser: 0.81 mg/dL (ref 0.57–1.00)
Globulin, Total: 2.6 g/dL (ref 1.5–4.5)
Glucose: 94 mg/dL (ref 70–99)
Potassium: 4.5 mmol/L (ref 3.5–5.2)
Sodium: 137 mmol/L (ref 134–144)
Total Protein: 7.2 g/dL (ref 6.0–8.5)
eGFR: 83 mL/min/{1.73_m2} (ref >59)

## 2023-02-23 LAB — VITAMIN D 25 HYDROXY (VIT D DEFICIENCY, FRACTURES): Vit D, 25-Hydroxy: 50.6 ng/mL (ref 30.0–100.0)

## 2023-02-23 LAB — TSH: TSH: 1.64 u[IU]/mL (ref 0.450–4.500)

## 2023-03-02 ENCOUNTER — Ambulatory Visit (INDEPENDENT_AMBULATORY_CARE_PROVIDER_SITE_OTHER): Admitting: Family Medicine

## 2023-03-02 ENCOUNTER — Encounter: Payer: Self-pay | Admitting: Family Medicine

## 2023-03-02 VITALS — BP 128/78 | HR 78 | Ht 66.5 in

## 2023-03-02 DIAGNOSIS — M25552 Pain in left hip: Secondary | ICD-10-CM

## 2023-03-02 MED ORDER — MELOXICAM 15 MG PO TABS
15.0000 mg | ORAL_TABLET | Freq: Every day | ORAL | 0 refills | Status: DC
Start: 2023-03-02 — End: 2024-01-13

## 2023-03-02 MED ORDER — CYCLOBENZAPRINE HCL 10 MG PO TABS
10.0000 mg | ORAL_TABLET | Freq: Every evening | ORAL | 0 refills | Status: DC | PRN
Start: 2023-03-02 — End: 2024-01-13

## 2023-03-02 NOTE — Progress Notes (Signed)
     Primary Care / Sports Medicine Office Visit  Patient Information:  Patient ID: Jessica Walter, female DOB: Jan 06, 1961 Age: 62 y.o. MRN: [id number]   Jessica Walter is a pleasant 62 y.o. female presenting with the following:  Chief Complaint  Patient presents with   Hip Pain    Left 1 year ago felt pull while pushing a cart    Vitals:   03/02/23 0901  BP: 128/78  Pulse: 78  SpO2: 98%   Vitals:   03/02/23 0901  Height: 5' 6.5" (1.689 m)   Body mass index is 38.12 kg/m.     Independent interpretation of notes and tests performed by another provider:   Independent interpretation of left hip x-rays dated 02/22/2023 demonstrate subtle inferior femoroacetabular space loss, prominent calcific changes about the greater trochanter, no acute osseous processes identified.  Procedures performed:   None  Pertinent History, Exam, Impression, and Recommendations:   Jessica Walter was seen today for hip pain.  Greater trochanteric pain syndrome of left lower extremity Overview: Onset early 2023 after pushing heavy cart  Assessment & Plan: Patient presents for chronic right lateral hip pain that has been symptomatic since early 2023 after pushing a heavy cart, felt a pulling/snapping sensation at the anterolateral hip, took appropriate precautions with rest and OTC NSAIDs, home exercises, and has noted steady progression though not resolution of symptoms.  Noting pain with ADLs, laying on left side, no radiation distally along the left leg, has noted some mild right-sided acute on chronic sciatic type symptoms.  Examination with equivocal FADIR testing, negative straight leg raise, Faber and piriformis testing markedly reduced due to decreased joint mobility localizing pain to the lateral hip, focal tenderness that recreates her symptoms at the greater trochanteric region and throughout the tensor fascia lata/mid-distal gluteus medius.  Findings most consistent with greater trochanteric pain  syndrome, most likely some degree of compensatory contralateral sciatica.  Plan as follows: - Scheduled meloxicam x 2 weeks then as needed - Nightly cyclobenzaprine as needed - Start home based rehab program after 1 week - Follow-up in 4 weeks - Suboptimal progress to be addressed with consideration of local corticosteroid injection under ultrasound guidance, modification of medications, formal PT; all as clinically guided  Orders: -     Meloxicam; Take 1 tablet (15 mg total) by mouth daily. X 2 weeks then once daily as-needed  Dispense: 30 tablet; Refill: 0 -     Cyclobenzaprine HCl; Take 1 tablet (10 mg total) by mouth at bedtime as needed for muscle spasms.  Dispense: 30 tablet; Refill: 0     Orders & Medications Meds ordered this encounter  Medications   meloxicam (MOBIC) 15 MG tablet    Sig: Take 1 tablet (15 mg total) by mouth daily. X 2 weeks then once daily as-needed    Dispense:  30 tablet    Refill:  0   cyclobenzaprine (FLEXERIL) 10 MG tablet    Sig: Take 1 tablet (10 mg total) by mouth at bedtime as needed for muscle spasms.    Dispense:  30 tablet    Refill:  0   No orders of the defined types were placed in this encounter.    Return in about 4 weeks (around 03/30/2023).     Jerrol Banana, MD, [facility]   Primary Care Sports Medicine Primary Care and Sports Medicine at [facility]

## 2023-03-02 NOTE — Patient Instructions (Signed)
-   Dose meloxicam once daily x 2 weeks then daily as needed (take with food and no other NSAIDs while on this medication) - Dose nightly cyclobenzaprine (muscle relaxer) as needed, side effect can be drowsiness - Start home exercises with information provided after 1 week then continue regularly until follow-up - Follow-up in 4 weeks

## 2023-03-02 NOTE — Assessment & Plan Note (Signed)
Patient presents for chronic right lateral hip pain that has been symptomatic since early 2023 after pushing a heavy cart, felt a pulling/snapping sensation at the anterolateral hip, took appropriate precautions with rest and OTC NSAIDs, home exercises, and has noted steady progression though not resolution of symptoms.  Noting pain with ADLs, laying on left side, no radiation distally along the left leg, has noted some mild right-sided acute on chronic sciatic type symptoms.  Examination with equivocal FADIR testing, negative straight leg raise, Faber and piriformis testing markedly reduced due to decreased joint mobility localizing pain to the lateral hip, focal tenderness that recreates her symptoms at the greater trochanteric region and throughout the tensor fascia lata/mid-distal gluteus medius.  Findings most consistent with greater trochanteric pain syndrome, most likely some degree of compensatory contralateral sciatica.  Plan as follows: - Scheduled meloxicam x 2 weeks then as needed - Nightly cyclobenzaprine as needed - Start home based rehab program after 1 week - Follow-up in 4 weeks - Suboptimal progress to be addressed with consideration of local corticosteroid injection under ultrasound guidance, modification of medications, formal PT; all as clinically guided

## 2023-03-05 ENCOUNTER — Encounter: Payer: Self-pay | Admitting: Family Medicine

## 2023-03-05 NOTE — Telephone Encounter (Signed)
Please advise 

## 2023-03-30 ENCOUNTER — Ambulatory Visit (INDEPENDENT_AMBULATORY_CARE_PROVIDER_SITE_OTHER): Admitting: Family Medicine

## 2023-03-30 ENCOUNTER — Encounter: Payer: Self-pay | Admitting: Family Medicine

## 2023-03-30 VITALS — BP 118/78 | HR 100 | Ht 66.5 in | Wt 241.0 lb

## 2023-03-30 DIAGNOSIS — M25552 Pain in left hip: Secondary | ICD-10-CM

## 2023-03-30 NOTE — Patient Instructions (Signed)
-   Transition to as needed dosing of meloxicam and cyclobenzaprine - Continue home exercise with focus on steady advance using symptoms as a guide - Return in 2-3 months, contact our office for any worsening symptoms over the interim

## 2023-03-30 NOTE — Assessment & Plan Note (Signed)
Patient presents for follow-up to left posterolateral hip pain with concern for greater trochanteric pain syndrome.  She reports excellent interim improvement, not requiring medications on a regular basis, compliant with home-based rehab every other day with an increase in baseline activity (walking on treadmill).  Examination today demonstrates interval improvement in tenderness at the posterolateral left greater trochanteric region, nontender throughout the gluteals, equivocal FABER testing, otherwise negative provocative test.  Plan: - Transition to as needed dosing of meloxicam and cyclobenzaprine - Continue home exercise with focus on steady advance using symptoms as a guide - Return in 2-3 months, contact our office for any worsening symptoms over the interim

## 2023-03-30 NOTE — Progress Notes (Signed)
     Primary Care / Sports Medicine Office Visit  Patient Information:  Patient ID: Jessica Walter, female DOB: Jun 10, 1961 Age: 62 y.o. MRN: [id number]   Jessica Walter is a pleasant 62 y.o. female presenting with the following:  Chief Complaint  Patient presents with   Greater trochanteric pain syndrome of left lower extremity    Vitals:   03/30/23 1318  BP: 118/78  Pulse: 100  SpO2: 100%   Vitals:   03/30/23 1318  Weight: 241 lb (109.3 kg)  Height: 5' 6.5" (1.689 m)   Body mass index is 38.32 kg/m.  No results found.   Independent interpretation of notes and tests performed by another provider:   None  Procedures performed:   None  Pertinent History, Exam, Impression, and Recommendations:   [name] was seen today for greater trochanteric pain syndrome of left lower extremity.  Greater trochanteric pain syndrome of left lower extremity Overview: Onset early 2023 after pushing heavy cart  Assessment & Plan: Patient presents for follow-up to left posterolateral hip pain with concern for greater trochanteric pain syndrome.  She reports excellent interim improvement, not requiring medications on a regular basis, compliant with home-based rehab every other day with an increase in baseline activity (walking on treadmill).  Examination today demonstrates interval improvement in tenderness at the posterolateral left greater trochanteric region, nontender throughout the gluteals, equivocal FABER testing, otherwise negative provocative test.  Plan: - Transition to as needed dosing of meloxicam and cyclobenzaprine - Continue home exercise with focus on steady advance using symptoms as a guide - Return in 2-3 months, contact our office for any worsening symptoms over the interim      Orders & Medications No orders of the defined types were placed in this encounter.  No orders of the defined types were placed in this encounter.    No follow-ups on file.     Jerrol Banana, MD, [facility]   Primary Care Sports Medicine Primary Care and Sports Medicine at [facility]

## 2023-05-06 ENCOUNTER — Ambulatory Visit
Admission: EM | Admit: 2023-05-06 | Discharge: 2023-05-06 | Disposition: A | Discharge status: Home / Self Care | Attending: Emergency Medicine | Admitting: Emergency Medicine

## 2023-05-06 DIAGNOSIS — R052 Subacute cough: Secondary | ICD-10-CM | POA: Diagnosis not present

## 2023-05-06 DIAGNOSIS — J069 Acute upper respiratory infection, unspecified: Secondary | ICD-10-CM

## 2023-05-06 MED ORDER — DOXYCYCLINE HYCLATE 100 MG PO CAPS: 100.0000 mg | ORAL_CAPSULE | Freq: Two times a day (BID) | ORAL | 0 refills | Status: AC

## 2023-05-06 MED ORDER — FLUTICASONE PROPIONATE 50 MCG/ACT NA SUSP: 2.0000 | Freq: Every day | NASAL | 1 refills | Status: AC

## 2023-05-06 MED ORDER — IPRATROPIUM BROMIDE 0.06 % NA SOLN: 2.0000 | Freq: Four times a day (QID) | NASAL | 12 refills | Status: DC

## 2023-05-06 MED ORDER — PROMETHAZINE-DM 6.25-15 MG/5ML PO SYRP: 5.0000 mL | ORAL_SOLUTION | Freq: Four times a day (QID) | ORAL | 0 refills | Status: DC | PRN

## 2023-05-06 MED ORDER — BENZONATATE 100 MG PO CAPS: 200.0000 mg | ORAL_CAPSULE | Freq: Three times a day (TID) | ORAL | 0 refills | Status: DC

## 2023-05-06 NOTE — ED Provider Notes (Signed)
[facility]    CSN: [id number] Arrival date & time: 05/06/23  0805      History   Chief Complaint Chief Complaint  Patient presents with   Sore Throat   Cough    HPI Jessica Walter is a 62 y.o. female.   HPI   62 year old female with a history of anxiety, anemia, GERD, and vitamin B and D deficiencies presents for evaluation of 4 weeks worth of respiratory symptoms to include nasal congestion, runny nose, itchy watery eyes, postnasal drip, scratchy throat, bilateral ear pain, and a nonproductive cough.  She denies shortness of breath, wheezing, or fever.  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  This is typically managed with Claritin-D but she reports at this time she feels like the Claritin-D made her symptoms worse.  Past Medical History:  Diagnosis Date   Anemia    Prior to menopause   Anxiety    Gastroesophageal reflux disease 10/15/2020   GERD (gastroesophageal reflux disease)    Vitamin B deficiency    Vitamin D deficiency    Wears contact lenses    sometimes    Patient Active Problem List   Diagnosis Date Noted   Adjustment reaction with anxiety and depression 02/22/2023   Greater trochanteric pain syndrome of left lower extremity 02/22/2023   Polyp of ascending colon    Environmental and seasonal allergies 02/13/2021   Neoplasm of uncertain behavior of skin 02/13/2021   Vitamin B12 deficiency 10/15/2020   Vitamin D deficiency 10/15/2020   Mixed hyperlipidemia 10/15/2020   Obesity (BMI 30-39.9) 10/15/2020    Past Surgical History:  Procedure Laterality Date   BREAST BIOPSY Right 01/07/2021   affirm bx distortion, ribbon marker, path pending   COLONOSCOPY WITH PROPOFOL N/A 03/03/2022   Procedure: COLONOSCOPY WITH PROPOFOL;  Surgeon: Toney Reil, MD;  Location: [facility];  Service: Endoscopy;  Laterality: N/A;   POLYPECTOMY   03/03/2022   Procedure: POLYPECTOMY;  Surgeon: Toney Reil, MD;  Location: [facility];  Service: Endoscopy;;    OB History   No obstetric history on file.      Home Medications    Prior to Admission medications   Medication Sig Start Date End Date Taking? Authorizing Provider  benzonatate (TESSALON) 100 MG capsule Take 2 capsules (200 mg total) by mouth every 8 (eight) hours. 05/06/23  Yes Becky Augusta, NP  Cholecalciferol (VITAMIN D-3) 125 MCG (5000 UT) TABS Take by mouth.   Yes [provider]  cyclobenzaprine (FLEXERIL) 10 MG tablet Take 1 tablet (10 mg total) by mouth at bedtime as needed for muscle spasms. 03/02/23  Yes Jerrol Banana, MD  doxycycline (VIBRAMYCIN) 100 MG capsule Take 1 capsule (100 mg total) by mouth 2 (two) times daily for 7 days. 05/06/23 05/13/23 Yes Becky Augusta, NP  fluticasone (FLONASE) 50 MCG/ACT nasal spray Place 2 sprays into both nostrils daily. 05/06/23  Yes Becky Augusta, NP  ipratropium (ATROVENT) 0.06 % nasal spray Place 2 sprays into both nostrils 4 (four) times daily. 05/06/23  Yes Becky Augusta, NP  loratadine (CLARITIN) 10 MG tablet Take 10 mg by mouth daily as needed for allergies.   Yes [provider]  Melatonin 10 MG TABS Take 3 mg by mouth at bedtime as needed. Olly Brand   Yes [provider]  meloxicam (MOBIC) 15 MG tablet Take 1 tablet (15 mg total) by  mouth daily. X 2 weeks then once daily as-needed 03/02/23  Yes Jerrol Banana, MD  Multiple Vitamins-Minerals (MULTIVITAMIN WITH MINERALS) tablet Take 1 tablet by mouth daily.   Yes [provider]  Probiotic Product (UP4 PROBIOTICS ADULT PO) Take by mouth.   Yes [provider]  promethazine-dextromethorphan (PROMETHAZINE-DM) 6.25-15 MG/5ML syrup Take 5 mLs by mouth 4 (four) times daily as needed. 05/06/23  Yes Becky Augusta, NP  VALERIAN ROOT PLUS PO Take by mouth.   Yes [provider]    Family History Family History   Problem Relation Age of Onset   Heart attack Mother    Heart disease Mother    Diabetes Mother    Heart disease Father    Cancer Brother    Breast cancer Neg Hx     Social History Social History   Tobacco Use   Smoking status: Never   Smokeless tobacco: Never  Vaping Use   Vaping status: Never Used  Substance Use Topics   Alcohol use: Yes    Comment: rare   Drug use: Never     Allergies   Amoxicillin   Review of Systems Review of Systems  Constitutional:  Negative for fever.  HENT:  Positive for congestion, ear pain, postnasal drip, rhinorrhea and sore throat.   Respiratory:  Positive for cough. Negative for shortness of breath and wheezing.      Physical Exam Triage Vital Signs ED Triage Vitals  Encounter Vitals Group     BP      Systolic BP Percentile      Diastolic BP Percentile      Pulse      Resp      Temp      Temp src      SpO2      Weight      Height      Head Circumference      Peak Flow      Pain Score      Pain Loc      Pain Education      Exclude from Growth Chart    No data found.  Updated Vital Signs BP (!) 153/84 (BP Location: Left Arm)   Pulse 68   Temp 98.5 F (36.9 C) (Oral)   Resp 16   Ht 5\' 7"  (1.702 m)   Wt 190 lb (86.2 kg)   SpO2 98%   BMI 29.76 kg/m   Visual Acuity Right Eye Distance:   Left Eye Distance:   Bilateral Distance:    Right Eye Near:   Left Eye Near:    Bilateral Near:     Physical Exam Vitals and nursing note reviewed.  Constitutional:      Appearance: Normal appearance. She is not ill-appearing.  HENT:     Head: Normocephalic and atraumatic.     Right Ear: Tympanic membrane, ear canal and external ear normal. There is no impacted cerumen.     Left Ear: Tympanic membrane, ear canal and external ear normal. There is no impacted cerumen.     Nose: Congestion and rhinorrhea present.     Comments: This mucosa is mildly edematous with scant clear discharge in both nares.    Mouth/Throat:      Mouth: Mucous membranes are moist.     Pharynx: Oropharynx is clear. Posterior oropharyngeal erythema present. No oropharyngeal exudate.     Comments: Posterior oropharynx is erythematous with clear postnasal drip. Cardiovascular:     Rate and Rhythm: Normal rate  and regular rhythm.     Pulses: Normal pulses.     Heart sounds: Normal heart sounds. No murmur heard.    No friction rub. No gallop.  Pulmonary:     Effort: Pulmonary effort is normal.     Breath sounds: Wheezing present. No rhonchi or rales.     Comments: Patient has clear lung sounds with normal respiration, however, with coughing she does demonstrate wheezing. Musculoskeletal:     Cervical back: Normal range of motion and neck supple. No tenderness.  Lymphadenopathy:     Cervical: No cervical adenopathy.  Skin:    General: Skin is warm and dry.     Capillary Refill: Capillary refill takes less than 2 seconds.     Findings: No rash.  Neurological:     General: No focal deficit present.     Mental Status: She is alert and oriented to person, place, and time.      UC Treatments / Results  Labs (all labs ordered are listed, but only abnormal results are displayed) Labs Reviewed - No data to display  EKG   Radiology No results found.  Procedures Procedures (including critical care time)  Medications Ordered in UC Medications - No data to display  Initial Impression / Assessment and Plan / UC Course  I have reviewed the triage vital signs and the nursing notes.  Pertinent labs & imaging results that were available during my care of the patient were reviewed by me and considered in my medical decision making (see chart for details).   Patient is a nontoxic-appearing 62 year old female presenting for evaluation of 4 weeks with respiratory symptoms as outlined HPI above.  She typically has allergies when she is in the [place] which is typically managed with Claritin-D though this time she feels like the symptoms  have been made worse.  She does have inflammation of her upper respiratory tract but her lungs are clear to auscultation except with cough.  She then demonstrates wheezing.  She is able speak in full sentence without dyspnea or tachypnea.  Given her protracted length of symptoms I do feel a trial of antibiotics is warranted.  I do feel that allergies are playing a role and I have encouraged her to continue the Claritin-D.  Additionally I will add on Flonase nasal spray and she can use at nighttime.  Also Atrovent for the interim to help with the nasal congestion and postnasal drip, Tessalon Perles and Promethazine DM cough syrup as needed for cough and congestion.  I will also start her on doxycycline 100 mg twice daily for 7 days for treatment of her URI as she has an allergy to amoxicillin.   Final Clinical Impressions(s) / UC Diagnoses   Final diagnoses:  Upper respiratory tract infection, unspecified type  Subacute cough     Discharge Instructions      I do feel that your symptoms are being caused by combination of both an upper respiratory infection as well as your allergy symptoms.  Continue your Claritin-D as you have been taking it and add on the Flonase nasal spray at nighttime.  You will do 2 squirts in each nostril at bedtime to control nasal allergy symptoms.  Take the doxycycline 100 mg twice daily with food for 7 days for treatment of your URI.  Use the Atrovent nasal spray, 2 squirts up each nostril every 6 hours, as needed for nasal congestion, runny nose, and postnasal drip.  He may use the [employer] during the day  as needed for cough.  Take them with a small sip of water.  They may give you numbness to the base of your tongue or metallic taste in her mouth, this is normal.  Use the Promethazine DM cough syrup at bedtime as needed for cough and congestion.  You may want to call make an appointment with [facility] allergy and asthma for repeat testing due to worsening allergy  symptoms.  If your symptoms continue, or they worsen, either return for reevaluation or see your PCP.     ED Prescriptions     Medication Sig Dispense Auth. Provider   benzonatate (TESSALON) 100 MG capsule Take 2 capsules (200 mg total) by mouth every 8 (eight) hours. 21 capsule Becky Augusta, NP   doxycycline (VIBRAMYCIN) 100 MG capsule Take 1 capsule (100 mg total) by mouth 2 (two) times daily for 7 days. 14 capsule Becky Augusta, NP   ipratropium (ATROVENT) 0.06 % nasal spray Place 2 sprays into both nostrils 4 (four) times daily. 15 mL Becky Augusta, NP   fluticasone [facility]) 50 MCG/ACT nasal spray Place 2 sprays into both nostrils daily. 18.2 mL Becky Augusta, NP   promethazine-dextromethorphan (PROMETHAZINE-DM) 6.25-15 MG/5ML syrup Take 5 mLs by mouth 4 (four) times daily as needed. 118 mL Becky Augusta, NP      PDMP not reviewed this encounter.   Becky Augusta, NP 05/06/23 [phone]

## 2023-05-06 NOTE — ED Triage Notes (Signed)
Pt c/o sore throat,cough,congestion & bilateral ear pain since 8/23. Has tried Claritin D & lozenges w/o relief.

## 2023-05-06 NOTE — Discharge Instructions (Addendum)
I do feel that your symptoms are being caused by combination of both an upper respiratory infection as well as your allergy symptoms.  Continue your Claritin-D as you have been taking it and add on the Flonase nasal spray at nighttime.  You will do 2 squirts in each nostril at bedtime to control nasal allergy symptoms.  Take the doxycycline 100 mg twice daily with food for 7 days for treatment of your URI.  Use the Atrovent nasal spray, 2 squirts up each nostril every 6 hours, as needed for nasal congestion, runny nose, and postnasal drip.  He may use the [employer] during the day as needed for cough.  Take them with a small sip of water.  They may give you numbness to the base of your tongue or metallic taste in her mouth, this is normal.  Use the Promethazine DM cough syrup at bedtime as needed for cough and congestion.  You may want to call make an appointment with [facility] allergy and asthma for repeat testing due to worsening allergy symptoms.  If your symptoms continue, or they worsen, either return for reevaluation or see your PCP.

## 2023-05-26 ENCOUNTER — Other Ambulatory Visit: Payer: Self-pay | Admitting: Internal Medicine

## 2023-05-26 ENCOUNTER — Encounter: Payer: Self-pay | Admitting: Internal Medicine

## 2023-05-26 DIAGNOSIS — J3089 Other allergic rhinitis: Secondary | ICD-10-CM

## 2023-05-26 NOTE — Telephone Encounter (Signed)
Pt response.  KP

## 2023-06-03 ENCOUNTER — Encounter: Payer: Self-pay | Admitting: Internal Medicine

## 2023-07-01 ENCOUNTER — Ambulatory Visit: Admitting: Family Medicine

## 2023-07-01 ENCOUNTER — Encounter: Payer: Self-pay | Admitting: Family Medicine

## 2023-07-01 ENCOUNTER — Ambulatory Visit (INDEPENDENT_AMBULATORY_CARE_PROVIDER_SITE_OTHER): Admitting: Family Medicine

## 2023-07-01 VITALS — BP 132/82 | HR 79 | Ht 67.0 in | Wt 229.0 lb

## 2023-07-01 DIAGNOSIS — M25552 Pain in left hip: Secondary | ICD-10-CM

## 2023-07-01 DIAGNOSIS — M25551 Pain in right hip: Secondary | ICD-10-CM | POA: Diagnosis not present

## 2023-07-01 NOTE — Progress Notes (Signed)
     Primary Care / Sports Medicine Office Visit  Patient Information:  Patient ID: Jessica Walter, female DOB: 1961-02-19 Age: 62 y.o. MRN: [id number]   Jessica Walter is a pleasant 62 y.o. female presenting with the following:  Chief Complaint  Patient presents with   Hip Pain    Left hip follow up. Getting better.     Vitals:   07/01/23 1000  BP: 132/82  Pulse: 79  SpO2: 97%   Vitals:   07/01/23 1000  Weight: 229 lb (103.9 kg)  Height: 5\' 7"  (1.702 m)   Body mass index is 35.87 kg/m.  No results found.   Independent interpretation of notes and tests performed by another provider:   None  Procedures performed:   None  Pertinent History, Exam, Impression, and Recommendations:   Problem List Items Addressed This Visit       Musculoskeletal and Integument   Greater trochanteric pain syndrome of left lower extremity - Primary    [name] returns for follow-up to primarily left posterolateral hip pain.  Over the interim, despite health and familial intervening issues, she has been able to continue with home-based rehab, continues to note improvement, and reports that she is overall doing very well.  Minimal interference with sleep when laying in a lateral recumbent position, additionally mild stiffness upon motion after period of immobility, no rating symptoms, overall doing well, has noted intentional weight loss with the increased activity.  Examination demonstrates nearly symmetric, interval improved, posterolateral greater trochanteric tenderness, additionally throughout the mid-distal glued medius bilaterally, Trendelenburg localized to the left and resisted hip abduction elicits minor pain left greater than right.  Overall improved exam.  She has demonstrated continued improvement both objectively and subjectively, plan as follows:  -Continue home-based exercises on a semiregular basis as you have been doing - Pay specific attention to "hip abduction" (bringing the  leg outward away from your body) to target the area that needs the most work - Can use ibuprofen and/or previous prescription medications on an as-needed basis for breakthrough pain control - Return in 6 months, contact for any questions/concerns between now and then      I provided a total time of 23 minutes including both face-to-face and non-face-to-face time on 07/01/2023 inclusive of time utilized for medical chart review, information gathering, care coordination with staff, and documentation completion.   Orders & Medications Medications: No orders of the defined types were placed in this encounter.  No orders of the defined types were placed in this encounter.    Return in about 6 months (around 12/29/2023) for 40 min follow-up hips.     Jerrol Banana, MD, [facility]   Primary Care Sports Medicine Primary Care and Sports Medicine at [facility]

## 2023-07-01 NOTE — Assessment & Plan Note (Signed)
Ms. Tarter returns for follow-up to primarily left posterolateral hip pain.  Over the interim, despite health and familial intervening issues, she has been able to continue with home-based rehab, continues to note improvement, and reports that she is overall doing very well.  Minimal interference with sleep when laying in a lateral recumbent position, additionally mild stiffness upon motion after period of immobility, no rating symptoms, overall doing well, has noted intentional weight loss with the increased activity.  Examination demonstrates nearly symmetric, interval improved, posterolateral greater trochanteric tenderness, additionally throughout the mid-distal glued medius bilaterally, Trendelenburg localized to the left and resisted hip abduction elicits minor pain left greater than right.  Overall improved exam.  She has demonstrated continued improvement both objectively and subjectively, plan as follows:  -Continue home-based exercises on a semiregular basis as you have been doing - Pay specific attention to "hip abduction" (bringing the leg outward away from your body) to target the area that needs the most work - Can use ibuprofen and/or previous prescription medications on an as-needed basis for breakthrough pain control - Return in 6 months, contact for any questions/concerns between now and then

## 2023-07-01 NOTE — Patient Instructions (Signed)
-   Continue home-based exercises on a semiregular basis as you have been doing - Pay specific attention to "hip abduction" (bringing the leg outward away from your body) to target the area that needs the most work - Can use ibuprofen and/or previous prescription medications on an as-needed basis for breakthrough pain control - Return in 6 months, contact for any questions/concerns between now and then

## 2023-12-14 ENCOUNTER — Encounter: Payer: Self-pay | Admitting: Family Medicine

## 2023-12-14 NOTE — Telephone Encounter (Signed)
 Spoke with patient. Appointment has been rescheduled

## 2023-12-16 ENCOUNTER — Ambulatory Visit: Payer: Self-pay | Admitting: Family Medicine

## 2024-01-13 ENCOUNTER — Encounter: Payer: Self-pay | Admitting: Family Medicine

## 2024-01-13 ENCOUNTER — Ambulatory Visit (INDEPENDENT_AMBULATORY_CARE_PROVIDER_SITE_OTHER): Payer: PRIVATE HEALTH INSURANCE | Admitting: Family Medicine

## 2024-01-13 VITALS — BP 130/88 | HR 68 | Ht 67.0 in | Wt 230.0 lb

## 2024-01-13 DIAGNOSIS — M25551 Pain in right hip: Secondary | ICD-10-CM

## 2024-01-13 DIAGNOSIS — M25552 Pain in left hip: Secondary | ICD-10-CM | POA: Diagnosis not present

## 2024-01-13 NOTE — Assessment & Plan Note (Signed)
 History of Present Illness Jessica Walter is a 63 year old female who presents with hip pain and activity routine follow-up.  She experiences discomfort in her hips, particularly on the left side, described as a 'little twinge' during excessive physical activity. The pain is located at the outer hips, especially where a child would be carried. The discomfort was more noticeable when she paused her exercise routine due to personal commitments but has improved since resuming her activities.  She has been actively involved in cleaning and clearing out her sister's husband's estate, which has increased her physical activity. She feels better when maintaining her morning routine, which includes exercises beneficial for her focus and overall well-being. She has been working on strengthening her back and paying attention to her posture, which has helped alleviate some discomfort.  She experiences mild stiffness and soreness in the outer hip area, humorously noting it allows her to 'predict the weather'. The left side is slightly more uncomfortable than the right, but the discomfort is manageable. She has been doing exercises and has noticed improvement in her balance, although she is still working on it.  Physical Exam: Interval improved tenderness, bilateral greater trochanteric region. Still residual left greater than right. Right side without tender SI joint and nontender throughout the gluteus medius. Mild tenderness at the left SI joint, but not noted throughout the gluteus medius minimus distribution. Trendelenburg localizes left. (01/13/2024)  Assessment and Plan Bilateral greater trochanteric pain Interval improvement in tenderness, more pronounced on the left. Mild discomfort with overexertion. Balance improved but needs attention on the left. - Continue exercise routine emphasizing hip abductor strengthening. - Increase exercise repetitions on the left to improve balance and strength. - Encourage  regular physical activity and self-care as patient has been doing.  Left sacroiliac joint tenderness Mild tenderness with improvement from exercise and posture attention. Discomfort manageable. - Continue exercises focusing on posture and core strengthening.

## 2024-01-13 NOTE — Progress Notes (Signed)
 Primary Care / Sports Medicine Office Visit  Patient Information:  Patient ID: Jessica Walter, female DOB: 29-Aug-1960 Age: 63 y.o. MRN: [id number]   Jessica Walter is a pleasant 63 y.o. female presenting with the following:  Chief Complaint  Patient presents with   Hip Pain    Bil hip pain has got better. Some morning she is stiff but she has started back on her exercise program.     Vitals:   01/13/24 1408  BP: 130/88  Pulse: 68  SpO2: 97%   Vitals:   01/13/24 1408  Weight: 230 lb (104.3 kg)  Height: 5\' 7"  (1.702 m)   Body mass index is 36.02 kg/m.  No results found.   Independent interpretation of notes and tests performed by another provider:   None  Procedures performed:   None  Pertinent History, Exam, Impression, and Recommendations:   Problem List Items Addressed This Visit     Greater trochanteric pain syndrome of both lower extremities - Primary   History of Present Illness Jessica Walter is a 63 year old female who presents with hip pain and activity routine follow-up.  She experiences discomfort in her hips, particularly on the left side, described as a 'little twinge' during excessive physical activity. The pain is located at the outer hips, especially where a child would be carried. The discomfort was more noticeable when she paused her exercise routine due to personal commitments but has improved since resuming her activities.  She has been actively involved in cleaning and clearing out her sister's husband's estate, which has increased her physical activity. She feels better when maintaining her morning routine, which includes exercises beneficial for her focus and overall well-being. She has been working on strengthening her back and paying attention to her posture, which has helped alleviate some discomfort.  She experiences mild stiffness and soreness in the outer hip area, humorously noting it allows her to 'predict the weather'. The left side is  slightly more uncomfortable than the right, but the discomfort is manageable. She has been doing exercises and has noticed improvement in her balance, although she is still working on it.  Physical Exam: Interval improved tenderness, bilateral greater trochanteric region. Still residual left greater than right. Right side without tender SI joint and nontender throughout the gluteus medius. Mild tenderness at the left SI joint, but not noted throughout the gluteus medius minimus distribution. Trendelenburg localizes left. (01/13/2024)  Assessment and Plan Bilateral greater trochanteric pain Interval improvement in tenderness, more pronounced on the left. Mild discomfort with overexertion. Balance improved but needs attention on the left. - Continue exercise routine emphasizing hip abductor strengthening. - Increase exercise repetitions on the left to improve balance and strength. - Encourage regular physical activity and self-care as patient has been doing.  Left sacroiliac joint tenderness Mild tenderness with improvement from exercise and posture attention. Discomfort manageable. - Continue exercises focusing on posture and core strengthening.      I provided a total time of 23 minutes including both face-to-face and non-face-to-face time on 01/13/2024 inclusive of time utilized for medical chart review, information gathering, care coordination with staff, and documentation completion.   Orders & Medications Medications: No orders of the defined types were placed in this encounter.  No orders of the defined types were placed in this encounter.    No follow-ups on file.     Ma Saupe, MD, [facility]   Primary Care Sports Medicine Primary Care and Sports Medicine at [facility]

## 2024-01-13 NOTE — Patient Instructions (Signed)
 Patient Plan  1. Hip Pain Management    - Continue your exercise routine with a focus on strengthening your hip abductors.    - Increase exercise repetitions on your left side to enhance balance and strength.    - Maintain regular physical activity and self-care practices.  2. Sacroiliac Joint Care    - Keep doing exercises that focus on improving posture and core strength.  Red Flags: - If you experience increased pain or any new symptoms, contact your healthcare provider promptly.

## 2024-02-24 ENCOUNTER — Other Ambulatory Visit (HOSPITAL_COMMUNITY)
Admission: RE | Admit: 2024-02-24 | Discharge: 2024-02-24 | Disposition: A | Discharge status: Home / Self Care | Source: Ambulatory Visit | Attending: Internal Medicine | Admitting: Internal Medicine

## 2024-02-24 ENCOUNTER — Encounter: Payer: Self-pay | Admitting: Internal Medicine

## 2024-02-24 ENCOUNTER — Ambulatory Visit (INDEPENDENT_AMBULATORY_CARE_PROVIDER_SITE_OTHER): Payer: Self-pay | Admitting: Internal Medicine

## 2024-02-24 VITALS — BP 128/74 | HR 73 | Ht 67.0 in | Wt 230.0 lb

## 2024-02-24 DIAGNOSIS — Z124 Encounter for screening for malignant neoplasm of cervix: Secondary | ICD-10-CM | POA: Insufficient documentation

## 2024-02-24 DIAGNOSIS — E782 Mixed hyperlipidemia: Secondary | ICD-10-CM

## 2024-02-24 DIAGNOSIS — Z131 Encounter for screening for diabetes mellitus: Secondary | ICD-10-CM

## 2024-02-24 DIAGNOSIS — Z0000 Encounter for general adult medical examination without abnormal findings: Secondary | ICD-10-CM | POA: Diagnosis not present

## 2024-02-24 DIAGNOSIS — F4323 Adjustment disorder with mixed anxiety and depressed mood: Secondary | ICD-10-CM

## 2024-02-24 DIAGNOSIS — Z1231 Encounter for screening mammogram for malignant neoplasm of breast: Secondary | ICD-10-CM | POA: Diagnosis not present

## 2024-02-24 DIAGNOSIS — E538 Deficiency of other specified B group vitamins: Secondary | ICD-10-CM

## 2024-02-24 DIAGNOSIS — E559 Vitamin D deficiency, unspecified: Secondary | ICD-10-CM

## 2024-02-24 NOTE — Assessment & Plan Note (Signed)
 Managed with diet only. 10 yr risk low. Will check labs.

## 2024-02-24 NOTE — Assessment & Plan Note (Signed)
 Supplemented orally

## 2024-02-24 NOTE — Patient Instructions (Signed)
 Call [facility] to schedule your mammogram at [phone].

## 2024-02-24 NOTE — Assessment & Plan Note (Signed)
 Supplemented. Last vitamin D  Lab Results  Component Value Date   VD25OH 50.6 02/22/2023

## 2024-02-24 NOTE — Assessment & Plan Note (Signed)
 Doing well over the past year; focusing on helping her sister has been helpful. She has not been in counseling or taking any medications as in the past. No SI/HI noted.  Sleep is good. Will continue to monitor.

## 2024-02-24 NOTE — Progress Notes (Signed)
 Date:  02/24/2024   Name:  Jessica Walter   DOB:  1960/12/14   MRN:  [id number]   Chief Complaint: Annual Exam Jessica Walter is a 63 y.o. female who presents today for her Complete Annual Exam. She feels well. She reports exercising. She reports she is sleeping well. Breast complaints - none. She is keeping busy helping her sister who was recently widowed.  She is active at home as well.  Health Maintenance  Topic Date Due   COVID-19 Vaccine (4 - 2024-25 season) 04/25/2023   Mammogram  12/29/2023   Pap with HPV screening  02/14/2024   Flu Shot  03/24/2024   Colon Cancer Screening  03/03/2029   DTaP/Tdap/Td vaccine (2 - Td or Tdap) 02/21/2033   Hepatitis C Screening  Completed   Zoster (Shingles) Vaccine  Completed   Hepatitis B Vaccine  Aged Out   HPV Vaccine  Aged Out   Meningitis B Vaccine  Aged Out   HIV Screening  Discontinued    Depression        This is a chronic problem.  The problem has been gradually improving since onset.  Associated symptoms include no fatigue, no hopelessness, does not have insomnia, no appetite change, no body aches, no myalgias, no headaches and no suicidal ideas.     The symptoms are aggravated by family issues.  Treatments tried: on many meds in the past - prefers not to take meds now.   Review of Systems  Constitutional:  Negative for appetite change, fatigue and unexpected weight change.  HENT:  Negative for trouble swallowing.   Eyes:  Negative for visual disturbance.  Respiratory:  Negative for cough, chest tightness, shortness of breath and wheezing.   Cardiovascular:  Negative for chest pain, palpitations and leg swelling.  Gastrointestinal:  Negative for abdominal pain, constipation and diarrhea.  Genitourinary:  Negative for dysuria, frequency and urgency.  Musculoskeletal:  Negative for arthralgias and myalgias.  Neurological:  Negative for dizziness, weakness, light-headedness and headaches.  Psychiatric/Behavioral:  Positive for  depression. Negative for dysphoric mood, sleep disturbance and suicidal ideas. The patient is not nervous/anxious and does not have insomnia.      Lab Results  Component Value Date   NA 137 02/22/2023   K 4.5 02/22/2023   CO2 24 02/22/2023   GLUCOSE 94 02/22/2023   BUN 15 02/22/2023   CREATININE 0.81 02/22/2023   CALCIUM 9.4 02/22/2023   EGFR 83 02/22/2023   Lab Results  Component Value Date   CHOL 234 (H) 02/22/2023   HDL 67 02/22/2023   LDLCALC 145 (H) 02/22/2023   TRIG 123 02/22/2023   CHOLHDL 3.5 02/22/2023   Lab Results  Component Value Date   TSH 1.640 02/22/2023   Lab Results  Component Value Date   HGBA1C 5.5 02/17/2022   Lab Results  Component Value Date   WBC 6.2 02/22/2023   HGB 14.1 02/22/2023   HCT 42.4 02/22/2023   MCV 92 02/22/2023   PLT 321 02/22/2023   Lab Results  Component Value Date   ALT 20 02/22/2023   AST 20 02/22/2023   ALKPHOS 85 02/22/2023   BILITOT 0.4 02/22/2023   Lab Results  Component Value Date   VD25OH 50.6 02/22/2023   Lab Results  Component Value Date   VITAMINB12 1,196 02/17/2022     Patient Active Problem List   Diagnosis Date Noted   Adjustment reaction with anxiety and depression 02/22/2023   Greater trochanteric pain syndrome of both lower  extremities 02/22/2023   Polyp of ascending colon    Environmental and seasonal allergies 02/13/2021   Neoplasm of uncertain behavior of skin 02/13/2021   Vitamin B12 deficiency 10/15/2020   Vitamin D  deficiency 10/15/2020   Mixed hyperlipidemia 10/15/2020   Obesity (BMI 30-39.9) 10/15/2020    Allergies  Allergen Reactions   Amoxicillin  Hives    Past Surgical History:  Procedure Laterality Date   BREAST BIOPSY Right 01/07/2021   affirm bx distortion, ribbon marker, path pending   COLONOSCOPY WITH PROPOFOL  N/A 03/03/2022   Procedure: COLONOSCOPY WITH PROPOFOL ;  Surgeon: Unk Corinn Skiff, MD;  Location: [facility];  Service: Endoscopy;  Laterality: N/A;    POLYPECTOMY  03/03/2022   Procedure: POLYPECTOMY;  Surgeon: Unk Corinn Skiff, MD;  Location: [facility];  Service: Endoscopy;;    Social History   Tobacco Use   Smoking status: Never   Smokeless tobacco: Never  Vaping Use   Vaping status: Never Used  Substance Use Topics   Alcohol use: Yes    Alcohol/week: 2.0 standard drinks of alcohol    Types: 2 Glasses of wine per week    Comment: rare   Drug use: Never     Medication list has been reviewed and updated.  Current Meds  Medication Sig   Cholecalciferol (VITAMIN D -3) 125 MCG (5000 UT) TABS Take by mouth.   fluticasone  (FLONASE ) 50 MCG/ACT nasal spray Place 2 sprays into both nostrils daily.   loratadine (CLARITIN) 10 MG tablet Take 10 mg by mouth daily as needed for allergies.   Melatonin 10 MG TABS Take 3 mg by mouth at bedtime as needed. Olly Brand   Multiple Vitamins-Minerals (MULTIVITAMIN WITH MINERALS) tablet Take 1 tablet by mouth daily.   Probiotic Product (UP4 PROBIOTICS ADULT PO) Take by mouth.   VALERIAN ROOT PLUS PO Take by mouth.       02/24/2024    8:13 AM 02/22/2023    8:26 AM 02/17/2022    8:38 AM 02/13/2021    9:14 AM  GAD 7 : Generalized Anxiety Score  Nervous, Anxious, on Edge 1 3 3 2   Control/stop worrying 1 3 2 2   Worry too much - different things 1 3 3 2   Trouble relaxing 0 3 2 1   Restless 0 3 3 1   Easily annoyed or irritable 0 3 2 0  Afraid - awful might happen 0 0 2 3  Total GAD 7 Score 3 18 17 11   Anxiety Difficulty Somewhat difficult Very difficult Very difficult Very difficult       02/24/2024    8:13 AM 02/22/2023    8:26 AM 02/17/2022    8:38 AM  Depression screen PHQ 2/9  Decreased Interest 0 0 1  Down, Depressed, Hopeless 0 0 1  PHQ - 2 Score 0 0 2  Altered sleeping 0 3 3  Tired, decreased energy 0 3 2  Change in appetite 0 3 2  Feeling bad or failure about yourself  0 0 2  Trouble concentrating 0 3 3  Moving slowly or fidgety/restless 0 0 2  Suicidal thoughts 0 0 0   PHQ-9 Score 0 12 16  Difficult doing work/chores Not difficult at all Somewhat difficult Somewhat difficult    BP Readings from Last 3 Encounters:  02/24/24 128/74  01/13/24 130/88  07/01/23 132/82    Physical Exam Vitals and nursing note reviewed.  Constitutional:      General: She is not in acute distress.    Appearance: She  is well-developed.  HENT:     Head: Normocephalic and atraumatic.     Right Ear: Tympanic membrane and ear canal normal.     Left Ear: Tympanic membrane and ear canal normal.     Nose:     Right Sinus: No maxillary sinus tenderness.     Left Sinus: No maxillary sinus tenderness.  Eyes:     General: No scleral icterus.       Right eye: No discharge.        Left eye: No discharge.     Conjunctiva/sclera: Conjunctivae normal.  Neck:     Thyroid: No thyromegaly.     Vascular: No carotid bruit.  Cardiovascular:     Rate and Rhythm: Normal rate and regular rhythm.     Pulses: Normal pulses.     Heart sounds: Normal heart sounds. No murmur heard. Pulmonary:     Effort: Pulmonary effort is normal. No respiratory distress.     Breath sounds: No wheezing.  Abdominal:     General: Bowel sounds are normal.     Palpations: Abdomen is soft.     Tenderness: There is no abdominal tenderness.  Genitourinary:    Labia:        Right: No tenderness, lesion or injury.        Left: No tenderness, lesion or injury.      Vagina: Normal.     Cervix: Normal.     Uterus: Normal.      Adnexa: Right adnexa normal and left adnexa normal.     Comments: Pap obtained. Musculoskeletal:     Cervical back: Normal range of motion. No erythema.     Right lower leg: No edema.     Left lower leg: No edema.  Lymphadenopathy:     Cervical: No cervical adenopathy.  Skin:    General: Skin is warm and dry.     Findings: No rash.  Neurological:     Mental Status: She is alert and oriented to person, place, and time.     Cranial Nerves: No cranial nerve deficit.     Sensory:  No sensory deficit.     Deep Tendon Reflexes: Reflexes are normal and symmetric.  Psychiatric:        Attention and Perception: Attention normal.        Mood and Affect: Mood normal.     Wt Readings from Last 3 Encounters:  02/24/24 230 lb (104.3 kg)  01/13/24 230 lb (104.3 kg)  07/01/23 229 lb (103.9 kg)    BP 128/74   Pulse 73   Ht 5' 7 (1.702 m)   Wt 230 lb (104.3 kg)   SpO2 95%   BMI 36.02 kg/m   Assessment and Plan:  Problem List Items Addressed This Visit       Unprioritized   Mixed hyperlipidemia (Chronic)   Managed with diet only. 10 yr risk low. Will check labs.      Relevant Orders   Lipid panel   Vitamin B12 deficiency   Supplemented orally      Relevant Orders   CBC with Differential/Platelet   Vitamin B12   Vitamin D  deficiency   Supplemented. Last vitamin D  Lab Results  Component Value Date   VD25OH 50.6 02/22/2023         Relevant Orders   VITAMIN D  25 Hydroxy (Vit-D Deficiency, Fractures)   Adjustment reaction with anxiety and depression   Doing well over the past year; focusing on helping her sister has  been helpful. She has not been in counseling or taking any medications as in the past. No SI/HI noted.  Sleep is good. Will continue to monitor.      Other Visit Diagnoses       Annual physical exam    -  Primary   up to date on screenings and immunizations   Relevant Orders   CBC with Differential/Platelet   Comprehensive metabolic panel with GFR   Hemoglobin A1c   Lipid panel   TSH   Vitamin B12   VITAMIN D  25 Hydroxy (Vit-D Deficiency, Fractures)     Encounter for screening mammogram for breast cancer       Relevant Orders   MM 3D SCREENING MAMMOGRAM BILATERAL BREAST     Encounter for screening for cervical cancer       Relevant Orders   Cytology - PAP     Screening for diabetes mellitus       Relevant Orders   Comprehensive metabolic panel with GFR   Hemoglobin A1c       No follow-ups on file.    Leita HILARIO Adie, MD [facility] [place]

## 2024-02-25 LAB — CBC WITH DIFFERENTIAL/PLATELET
Basophils Absolute: 0.1 x10E3/uL (ref 0.0–0.2)
Basos: 2 %
EOS (ABSOLUTE): 0.1 x10E3/uL (ref 0.0–0.4)
Eos: 2 %
Hematocrit: 43.4 % (ref 34.0–46.6)
Hemoglobin: 14.1 g/dL (ref 11.1–15.9)
Immature Grans (Abs): 0 x10E3/uL (ref 0.0–0.1)
Immature Granulocytes: 0 %
Lymphocytes Absolute: 2.1 x10E3/uL (ref 0.7–3.1)
Lymphs: 34 %
MCH: 30.2 pg (ref 26.6–33.0)
MCHC: 32.5 g/dL (ref 31.5–35.7)
MCV: 93 fL (ref 79–97)
Monocytes Absolute: 0.3 x10E3/uL (ref 0.1–0.9)
Monocytes: 5 %
Neutrophils Absolute: 3.5 x10E3/uL (ref 1.4–7.0)
Neutrophils: 57 %
Platelets: 340 x10E3/uL (ref 150–450)
RBC: 4.67 x10E6/uL (ref 3.77–5.28)
RDW: 12.4 % (ref 11.7–15.4)
WBC: 6.1 x10E3/uL (ref 3.4–10.8)

## 2024-02-25 LAB — COMPREHENSIVE METABOLIC PANEL WITH GFR
ALT: 20 IU/L (ref 0–32)
AST: 22 IU/L (ref 0–40)
Albumin: 4.6 g/dL (ref 3.9–4.9)
Alkaline Phosphatase: 85 IU/L (ref 44–121)
BUN/Creatinine Ratio: 15 (ref 12–28)
BUN: 12 mg/dL (ref 8–27)
Bilirubin Total: 0.5 mg/dL (ref 0.0–1.2)
CO2: 22 mmol/L (ref 20–29)
Calcium: 9.1 mg/dL (ref 8.7–10.3)
Chloride: 101 mmol/L (ref 96–106)
Creatinine, Ser: 0.82 mg/dL (ref 0.57–1.00)
Globulin, Total: 2.7 g/dL (ref 1.5–4.5)
Glucose: 107 mg/dL — ABNORMAL HIGH (ref 70–99)
Potassium: 4.4 mmol/L (ref 3.5–5.2)
Sodium: 138 mmol/L (ref 134–144)
Total Protein: 7.3 g/dL (ref 6.0–8.5)
eGFR: 81 mL/min/1.73 (ref >59)

## 2024-02-25 LAB — HEMOGLOBIN A1C
Est. average glucose Bld gHb Est-mCnc: 114 mg/dL
Hgb A1c MFr Bld: 5.6 % (ref 4.8–5.6)

## 2024-02-25 LAB — LIPID PANEL
Chol/HDL Ratio: 3.5 ratio (ref 0.0–4.4)
Cholesterol, Total: 223 mg/dL — ABNORMAL HIGH (ref 100–199)
HDL: 64 mg/dL (ref >39)
LDL Chol Calc (NIH): 138 mg/dL — ABNORMAL HIGH (ref 0–99)
Triglycerides: 118 mg/dL (ref 0–149)
VLDL Cholesterol Cal: 21 mg/dL (ref 5–40)

## 2024-02-25 LAB — VITAMIN D 25 HYDROXY (VIT D DEFICIENCY, FRACTURES): Vit D, 25-Hydroxy: 40.7 ng/mL (ref 30.0–100.0)

## 2024-02-25 LAB — VITAMIN B12: Vitamin B-12: 507 pg/mL (ref 232–1245)

## 2024-02-25 LAB — TSH: TSH: 1.6 u[IU]/mL (ref 0.450–4.500)

## 2024-02-27 ENCOUNTER — Ambulatory Visit: Payer: Self-pay | Admitting: Internal Medicine

## 2024-02-27 DIAGNOSIS — E782 Mixed hyperlipidemia: Secondary | ICD-10-CM

## 2024-03-02 LAB — CYTOLOGY - PAP
Comment: NEGATIVE
Diagnosis: NEGATIVE
High risk HPV: NEGATIVE

## 2024-03-10 ENCOUNTER — Encounter: Payer: Self-pay | Admitting: Internal Medicine

## 2024-03-13 ENCOUNTER — Ambulatory Visit (INDEPENDENT_AMBULATORY_CARE_PROVIDER_SITE_OTHER): Admitting: Internal Medicine

## 2024-03-13 ENCOUNTER — Encounter: Payer: Self-pay | Admitting: Internal Medicine

## 2024-03-13 VITALS — BP 120/74 | HR 93 | Ht 67.0 in | Wt 230.0 lb

## 2024-03-13 DIAGNOSIS — F4323 Adjustment disorder with mixed anxiety and depressed mood: Secondary | ICD-10-CM | POA: Diagnosis not present

## 2024-03-13 MED ORDER — CLONAZEPAM 0.5 MG PO TABS
0.2500 mg | ORAL_TABLET | Freq: Two times a day (BID) | ORAL | 0 refills | Status: AC | PRN
Start: 2024-03-13 — End: ?
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
## 2024-03-13 NOTE — Assessment & Plan Note (Signed)
 Moderate worsening acutely due to several factors. These are expected to resolve in the near future and should not be an ongoing concern. Recommend Clonazepam  low dose bid PRN. Will give letter to excuse her from [employer].

## 2024-04-13 ENCOUNTER — Ambulatory Visit
Admission: RE | Admit: 2024-04-13 | Discharge: 2024-04-13 | Disposition: A | Discharge status: Home / Self Care | Source: Ambulatory Visit | Attending: Internal Medicine | Admitting: Internal Medicine

## 2024-04-13 DIAGNOSIS — Z1231 Encounter for screening mammogram for malignant neoplasm of breast: Secondary | ICD-10-CM | POA: Insufficient documentation

## 2024-04-26 ENCOUNTER — Encounter: Payer: Self-pay | Admitting: Internal Medicine

## 2024-04-27 NOTE — Telephone Encounter (Signed)
 Please review.  KP

## 2024-07-03 ENCOUNTER — Ambulatory Visit (INDEPENDENT_AMBULATORY_CARE_PROVIDER_SITE_OTHER)

## 2024-07-03 ENCOUNTER — Ambulatory Visit
Admission: EM | Admit: 2024-07-03 | Discharge: 2024-07-03 | Disposition: A | Discharge status: Home / Self Care | Attending: Emergency Medicine | Admitting: Emergency Medicine

## 2024-07-03 DIAGNOSIS — S40012A Contusion of left shoulder, initial encounter: Secondary | ICD-10-CM | POA: Diagnosis not present

## 2024-07-03 DIAGNOSIS — W19XXXA Unspecified fall, initial encounter: Secondary | ICD-10-CM

## 2024-07-03 DIAGNOSIS — S6392XA Sprain of unspecified part of left wrist and hand, initial encounter: Secondary | ICD-10-CM

## 2024-07-03 DIAGNOSIS — M79642 Pain in left hand: Secondary | ICD-10-CM

## 2024-07-03 NOTE — ED Provider Notes (Addendum)
 [facility]    CSN: [id number] Arrival date & time: 07/03/24  1519      History   Chief Complaint Chief Complaint  Patient presents with   Fall    HPI Jessica Walter is a 63 y.o. female.   HPI  63 year old female with past medical history significant for GERD, anxiety, seasonal allergies presents for evaluation of pain in the palm of her left hand status post fall.  She reports that she was stepping down off of a stepladder and her left foot caught causing her to fall back and catch herself with her left hand.  She also landed on her left shoulder and left buttock.  She has full range of motion of her wrist and shoulder.  She has bruising to the thenar prominence of her palm.  She took 400 mg of ibuprofen prior to arrival.  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   Greater trochanteric pain syndrome of both lower extremities 02/22/2023   Polyp of ascending colon    Environmental and seasonal allergies 02/13/2021   Neoplasm of uncertain behavior of skin 02/13/2021   Vitamin B12 deficiency 10/15/2020   Vitamin D  deficiency 10/15/2020   Mixed hyperlipidemia 10/15/2020   Obesity (BMI 30-39.9) 10/15/2020    Past Surgical History:  Procedure Laterality Date   BREAST BIOPSY Right 01/07/2021   affirm bx distortion, ribbon marker, path pending   COLONOSCOPY WITH PROPOFOL  N/A 03/03/2022   Procedure: COLONOSCOPY WITH PROPOFOL ;  Surgeon: Unk Corinn Skiff, MD;  Location: [facility];  Service: Endoscopy;  Laterality: N/A;   POLYPECTOMY  03/03/2022   Procedure: POLYPECTOMY;  Surgeon:  Unk Corinn Skiff, MD;  Location: [facility];  Service: Endoscopy;;    OB History   No obstetric history on file.      Home Medications    Prior to Admission medications   Medication Sig Start Date End Date Taking? Authorizing Provider  Cholecalciferol (VITAMIN D -3) 125 MCG (5000 UT) TABS Take by mouth.    [provider]  clonazePAM  (KLONOPIN ) 0.5 MG tablet Take 0.5-1 tablets (0.25-0.5 mg total) by mouth 2 (two) times daily as needed for anxiety. 03/13/24   Justus Leita DEL, MD  fluticasone  (FLONASE ) 50 MCG/ACT nasal spray Place 2 sprays into both nostrils daily. 05/06/23   Bernardino Ditch, NP  loratadine (CLARITIN) 10 MG tablet Take 10 mg by mouth daily as needed for allergies.    [provider]  Melatonin 10 MG TABS Take 3 mg by mouth at bedtime as needed. Kerri Adolphus    [provider]  Multiple Vitamins-Minerals (MULTIVITAMIN WITH MINERALS) tablet Take 1 tablet by mouth daily.    [provider]  Probiotic Product (UP4 PROBIOTICS ADULT PO) Take by mouth.    [provider]  VALERIAN ROOT PLUS PO Take by mouth.    [provider]    Family History Family History  Problem Relation Age of Onset   Heart attack Mother    Heart disease Mother    Diabetes Mother  Obesity Mother    Stroke Mother    Varicose Veins Mother    Heart disease Father    Cancer Brother    Diabetes Brother    Obesity Maternal Grandmother    Hyperlipidemia Sister    Varicose Veins Sister    Breast cancer Neg Hx     Social History Social History   Tobacco Use   Smoking status: Never   Smokeless tobacco: Never  Vaping Use   Vaping status: Never Used  Substance Use Topics   Alcohol use: Yes    Alcohol/week: 2.0 standard drinks of alcohol    Types: 2 Glasses of wine per week    Comment: rare   Drug use: Never     Allergies   Amoxicillin  and Trazodone and nefazodone   Review of Systems Review of Systems  Musculoskeletal:   Positive for arthralgias and joint swelling.  Skin:  Positive for color change.  Neurological:  Negative for weakness and numbness.     Physical Exam Triage Vital Signs ED Triage Vitals  Encounter Vitals Group     BP      Girls Systolic BP Percentile      Girls Diastolic BP Percentile      Boys Systolic BP Percentile      Boys Diastolic BP Percentile      Pulse      Resp      Temp      Temp src      SpO2      Weight      Height      Head Circumference      Peak Flow      Pain Score      Pain Loc      Pain Education      Exclude from Growth Chart    No data found.  Updated Vital Signs BP (!) 165/80 (BP Location: Right Arm)   Pulse 60   Temp 97.9 F (36.6 C) (Oral)   Resp 17   Wt 236 lb 6.4 oz (107.2 kg)   SpO2 97%   BMI 37.03 kg/m   Visual Acuity Right Eye Distance:   Left Eye Distance:   Bilateral Distance:    Right Eye Near:   Left Eye Near:    Bilateral Near:     Physical Exam Vitals and nursing note reviewed.  Constitutional:      Appearance: Normal appearance. She is not ill-appearing.  HENT:     Head: Normocephalic and atraumatic.  Musculoskeletal:        General: Swelling, tenderness and signs of injury present.  Skin:    General: Skin is warm and dry.     Capillary Refill: Capillary refill takes less than 2 seconds.     Findings: Bruising present.  Neurological:     General: No focal deficit present.     Mental Status: She is alert and oriented to person, place, and time.      UC Treatments / Results  Labs (all labs ordered are listed, but only abnormal results are displayed) Labs Reviewed - No data to display  EKG   Radiology No results found.  Procedures Procedures (including critical care time)  Medications Ordered in UC Medications - No data to display  Initial Impression / Assessment and Plan / UC Course  I have reviewed the triage vital signs and the nursing notes.  Pertinent labs & imaging results that were  available during my care of the patient were reviewed by  me and considered in my medical decision making (see chart for details).   Patient is a pleasant, nontoxic-appearing 63 year old female presenting for evaluation of musculoskeletal complaints status post ground-level fall earlier today.  She reports that she landed predominantly on her buttock but also caught herself with her left hand and landed on her left shoulder.  She has full range of motion of her left shoulder without difficulty and she has full range of motion of her hand and wrist without difficulty.  There is bruising to the thenar prominence of her left hand.  It is tender to palpation in that area.  Distal sensation is intact and her grip strength is 5/5 in the left hand.  Cap refills less than 3 seconds.  I suspect this is most likely a sprained wrist but I will obtain a radiograph of the left hand and wrist to evaluate for any bony abnormality.  Left hand x-rays independently reviewed and evaluated by me.  Impression: No evidence of fracture or dislocation.  Mild soft tissue swelling is present.  Radiology overread is pending. Radiology impression states negative exam.  I will discharge patient home with a diagnosis of ground-level fall, left shoulder contusion, and left hand sprain.  I will have staff place her in a Velcro wrist splint to help protect her wrist from further injury and keep it in neutral position to decrease inflammation.  She should wear it for the next week to 10 days.  She may take it off periodically throughout the day to perform range of motion exercises.  She may use over-the-counter Tylenol  and/or ibuprofen as needed for pain.  She may also apply ice for 20 minutes at a time, 2-3 times a day, as needed for pain and inflammation.  If her pain does not improve I will have her follow-up with Dr. Selinda Ku.   Final Clinical Impressions(s) / UC Diagnoses   Final diagnoses:  Left hand pain  Fall, initial  encounter  Contusion of left shoulder, initial encounter  Hand sprain, left, initial encounter     Discharge Instructions      Your x-rays do not show evidence of broken bones.  There is some mild soft tissue swelling indicating bruising of the soft tissue.  Wear the wrist splint to help keep your wrist and hand in a neutral position.  I would recommend wearing it for the next several days to give the inflammation a chance to go down and to protect your hand and wrist from further injury.  You may remove the wrist splint to wash your hands and bathe.  I would also recommend taking it off once or twice a day and putting your hand and wrist through a range of motion to help maintain mobility.  You may apply ice to all areas of pain for 20 minutes at a time, 2-3 times a day, as needed for pain and inflammation.  You may use over-the-counter Tylenol  and or ibuprofen according to the package instructions as needed for pain and inflammation.  If you develop any new or worsening symptoms other return for reevaluation or follow-up with Dr. Selinda Ku.     ED Prescriptions   None    PDMP not reviewed this encounter.   Bernardino Ditch, NP 07/03/24 1620    Bernardino Ditch, NP 07/03/24 1620    Bernardino Ditch, NP 07/03/24 [phone]

## 2024-07-03 NOTE — ED Triage Notes (Addendum)
 Patient states that she went to step down off of a step ladder and fell injuring her left shoulder, caught herself with her left hand. Patient complains of left hand/wrist pain. Took advil. Happened today

## 2024-07-03 NOTE — Discharge Instructions (Addendum)
 Your x-rays do not show evidence of broken bones.  There is some mild soft tissue swelling indicating bruising of the soft tissue.  Wear the wrist splint to help keep your wrist and hand in a neutral position.  I would recommend wearing it for the next several days to give the inflammation a chance to go down and to protect your hand and wrist from further injury.  You may remove the wrist splint to wash your hands and bathe.  I would also recommend taking it off once or twice a day and putting your hand and wrist through a range of motion to help maintain mobility.  You may apply ice to all areas of pain for 20 minutes at a time, 2-3 times a day, as needed for pain and inflammation.  You may use over-the-counter Tylenol  and or ibuprofen according to the package instructions as needed for pain and inflammation.  If you develop any new or worsening symptoms other return for reevaluation or follow-up with Dr. Selinda Ku.

## 2024-08-01 ENCOUNTER — Ambulatory Visit: Admitting: Internal Medicine

## 2024-09-19 ENCOUNTER — Encounter: Admitting: Student

## 2024-11-28 ENCOUNTER — Encounter: Admitting: Student
